# Patient Record
Sex: Male | Born: 1935 | Race: White | Hispanic: No | Smoking: Never smoker
Health system: Southern US, Community
[De-identification: ages and names within clinical notes are randomized; demographics above are authoritative.]

## PROBLEM LIST (undated history)

## (undated) DIAGNOSIS — I10 Essential (primary) hypertension: Secondary | ICD-10-CM

## (undated) DIAGNOSIS — N289 Disorder of kidney and ureter, unspecified: Secondary | ICD-10-CM

## (undated) DIAGNOSIS — C801 Malignant (primary) neoplasm, unspecified: Secondary | ICD-10-CM

## (undated) DIAGNOSIS — F0280 Dementia in other diseases classified elsewhere without behavioral disturbance: Secondary | ICD-10-CM

## (undated) DIAGNOSIS — G309 Alzheimer's disease, unspecified: Secondary | ICD-10-CM

## (undated) HISTORY — PX: CHOLECYSTECTOMY: SHX55

---

## 1997-05-04 ENCOUNTER — Other Ambulatory Visit: Admission: RE | Admit: 1997-05-04 | Discharge: 1997-05-04 | Payer: Self-pay | Admitting: Urology

## 1997-05-13 ENCOUNTER — Encounter: Admission: RE | Admit: 1997-05-13 | Discharge: 1997-08-11 | Payer: Self-pay | Admitting: Radiation Oncology

## 1997-07-30 ENCOUNTER — Ambulatory Visit (HOSPITAL_COMMUNITY): Admission: RE | Admit: 1997-07-30 | Discharge: 1997-07-31 | Payer: Self-pay | Admitting: Urology

## 1997-08-29 ENCOUNTER — Emergency Department (HOSPITAL_COMMUNITY): Admission: EM | Admit: 1997-08-29 | Discharge: 1997-08-29 | Payer: Self-pay | Admitting: Emergency Medicine

## 1997-08-30 ENCOUNTER — Encounter: Admission: RE | Admit: 1997-08-30 | Discharge: 1997-11-28 | Payer: Self-pay | Admitting: Radiation Oncology

## 1997-09-02 ENCOUNTER — Ambulatory Visit (HOSPITAL_COMMUNITY): Admission: RE | Admit: 1997-09-02 | Discharge: 1997-09-02 | Payer: Self-pay | Admitting: Urology

## 2000-12-26 ENCOUNTER — Inpatient Hospital Stay (HOSPITAL_COMMUNITY): Admission: EM | Admit: 2000-12-26 | Discharge: 2000-12-27 | Payer: Self-pay | Admitting: Emergency Medicine

## 2000-12-27 ENCOUNTER — Encounter: Payer: Self-pay | Admitting: Interventional Cardiology

## 2002-04-02 ENCOUNTER — Ambulatory Visit (HOSPITAL_COMMUNITY): Admission: RE | Admit: 2002-04-02 | Discharge: 2002-04-02 | Payer: Self-pay | Admitting: Internal Medicine

## 2005-06-07 ENCOUNTER — Ambulatory Visit: Payer: Self-pay | Admitting: Internal Medicine

## 2005-06-07 ENCOUNTER — Ambulatory Visit (HOSPITAL_COMMUNITY): Admission: RE | Admit: 2005-06-07 | Discharge: 2005-06-07 | Payer: Self-pay | Admitting: Internal Medicine

## 2005-06-07 ENCOUNTER — Encounter (INDEPENDENT_AMBULATORY_CARE_PROVIDER_SITE_OTHER): Payer: Self-pay | Admitting: Specialist

## 2008-12-15 ENCOUNTER — Emergency Department (HOSPITAL_COMMUNITY): Admission: EM | Admit: 2008-12-15 | Discharge: 2008-12-15 | Payer: Self-pay | Admitting: Emergency Medicine

## 2008-12-18 ENCOUNTER — Inpatient Hospital Stay (HOSPITAL_COMMUNITY): Admission: EM | Admit: 2008-12-18 | Discharge: 2008-12-28 | Payer: Self-pay | Admitting: Emergency Medicine

## 2008-12-18 ENCOUNTER — Ambulatory Visit: Payer: Self-pay | Admitting: Cardiology

## 2008-12-21 ENCOUNTER — Encounter (INDEPENDENT_AMBULATORY_CARE_PROVIDER_SITE_OTHER): Payer: Self-pay | Admitting: Family Medicine

## 2009-01-04 ENCOUNTER — Ambulatory Visit (HOSPITAL_COMMUNITY): Admission: RE | Admit: 2009-01-04 | Discharge: 2009-01-04 | Payer: Self-pay | Admitting: Family Medicine

## 2010-05-03 LAB — RAPID URINE DRUG SCREEN, HOSP PERFORMED
Amphetamines: NOT DETECTED
Benzodiazepines: NOT DETECTED
Cocaine: NOT DETECTED

## 2010-05-03 LAB — DIFFERENTIAL
Basophils Absolute: 0 10*3/uL (ref 0.0–0.1)
Basophils Absolute: 0 10*3/uL (ref 0.0–0.1)
Basophils Relative: 0 % (ref 0–1)
Basophils Relative: 0 % (ref 0–1)
Basophils Relative: 0 % (ref 0–1)
Basophils Relative: 1 % (ref 0–1)
Eosinophils Absolute: 0 10*3/uL (ref 0.0–0.7)
Eosinophils Relative: 0 % (ref 0–5)
Lymphocytes Relative: 13 % (ref 12–46)
Lymphocytes Relative: 9 % — ABNORMAL LOW (ref 12–46)
Lymphs Abs: 0.4 10*3/uL — ABNORMAL LOW (ref 0.7–4.0)
Lymphs Abs: 0.5 10*3/uL — ABNORMAL LOW (ref 0.7–4.0)
Lymphs Abs: 1.1 10*3/uL (ref 0.7–4.0)
Monocytes Absolute: 0.4 10*3/uL (ref 0.1–1.0)
Monocytes Absolute: 0.4 10*3/uL (ref 0.1–1.0)
Monocytes Absolute: 1 10*3/uL (ref 0.1–1.0)
Monocytes Relative: 3 % (ref 3–12)
Monocytes Relative: 5 % (ref 3–12)
Neutro Abs: 3.7 10*3/uL (ref 1.7–7.7)
Neutrophils Relative %: 76 % (ref 43–77)
Neutrophils Relative %: 89 % — ABNORMAL HIGH (ref 43–77)
Neutrophils Relative %: 93 % — ABNORMAL HIGH (ref 43–77)

## 2010-05-03 LAB — CBC
HCT: 32.6 % — ABNORMAL LOW (ref 39.0–52.0)
Hemoglobin: 11.1 g/dL — ABNORMAL LOW (ref 13.0–17.0)
MCHC: 33.8 g/dL (ref 30.0–36.0)
MCHC: 34 g/dL (ref 30.0–36.0)
MCHC: 34.3 g/dL (ref 30.0–36.0)
MCHC: 34.4 g/dL (ref 30.0–36.0)
MCV: 90.8 fL (ref 78.0–100.0)
MCV: 91.1 fL (ref 78.0–100.0)
MCV: 91.4 fL (ref 78.0–100.0)
Platelets: 133 10*3/uL — ABNORMAL LOW (ref 150–400)
Platelets: 181 10*3/uL (ref 150–400)
RBC: 3.56 MIL/uL — ABNORMAL LOW (ref 4.22–5.81)
RBC: 4 MIL/uL — ABNORMAL LOW (ref 4.22–5.81)
RDW: 14 % (ref 11.5–15.5)
WBC: 11.8 10*3/uL — ABNORMAL HIGH (ref 4.0–10.5)
WBC: 8.2 10*3/uL (ref 4.0–10.5)
WBC: 8.6 10*3/uL (ref 4.0–10.5)

## 2010-05-03 LAB — COMPREHENSIVE METABOLIC PANEL
BUN: 46 mg/dL — ABNORMAL HIGH (ref 6–23)
Calcium: 8.9 mg/dL (ref 8.4–10.5)
Chloride: 104 mEq/L (ref 96–112)
Chloride: 106 mEq/L (ref 96–112)
Creatinine, Ser: 1.48 mg/dL (ref 0.4–1.5)
Creatinine, Ser: 3.5 mg/dL — ABNORMAL HIGH (ref 0.4–1.5)
GFR calc Af Amer: 20 mL/min — ABNORMAL LOW (ref >60)
GFR calc Af Amer: 55 mL/min — ABNORMAL LOW (ref >60)
GFR calc non Af Amer: 17 mL/min — ABNORMAL LOW (ref >60)
Glucose, Bld: 109 mg/dL — ABNORMAL HIGH (ref 70–99)
Glucose, Bld: 165 mg/dL — ABNORMAL HIGH (ref 70–99)
Potassium: 4.6 mEq/L (ref 3.5–5.1)
Sodium: 137 mEq/L (ref 135–145)
Total Bilirubin: 1 mg/dL (ref 0.3–1.2)
Total Protein: 6.2 g/dL (ref 6.0–8.3)

## 2010-05-03 LAB — BLOOD GAS, ARTERIAL
Acid-base deficit: 3.5 mmol/L — ABNORMAL HIGH (ref 0.0–2.0)
Bicarbonate: 21.1 mEq/L (ref 20.0–24.0)
O2 Content: 2 L/min
O2 Saturation: 94.8 %
pCO2 arterial: 38.5 mmHg (ref 35.0–45.0)
pO2, Arterial: 71.8 mmHg — ABNORMAL LOW (ref 80.0–100.0)

## 2010-05-03 LAB — POCT CARDIAC MARKERS
CKMB, poc: <1 ng/mL — ABNORMAL LOW (ref 1.0–8.0)
CKMB, poc: <1 ng/mL — ABNORMAL LOW (ref 1.0–8.0)
Troponin i, poc: <0.05 ng/mL (ref 0.00–0.09)

## 2010-05-03 LAB — BASIC METABOLIC PANEL
BUN: 21 mg/dL (ref 6–23)
CO2: 23 mEq/L (ref 19–32)
CO2: 25 mEq/L (ref 19–32)
Calcium: 8.4 mg/dL (ref 8.4–10.5)
Chloride: 109 mEq/L (ref 96–112)
Chloride: 111 mEq/L (ref 96–112)
Creatinine, Ser: 1.31 mg/dL (ref 0.4–1.5)
GFR calc Af Amer: 36 mL/min — ABNORMAL LOW (ref >60)
GFR calc Af Amer: 59 mL/min — ABNORMAL LOW (ref >60)
GFR calc non Af Amer: 49 mL/min — ABNORMAL LOW (ref >60)
GFR calc non Af Amer: 52 mL/min — ABNORMAL LOW (ref >60)
Glucose, Bld: 100 mg/dL — ABNORMAL HIGH (ref 70–99)
Potassium: 3.9 mEq/L (ref 3.5–5.1)
Potassium: 3.9 mEq/L (ref 3.5–5.1)
Sodium: 139 mEq/L (ref 135–145)
Sodium: 142 mEq/L (ref 135–145)

## 2010-05-03 LAB — GLUCOSE, CAPILLARY: Glucose-Capillary: 88 mg/dL (ref 70–99)

## 2010-05-03 LAB — CULTURE, BLOOD (ROUTINE X 2)
Culture: NO GROWTH
Report Status: 11252010
Report Status: 11252010

## 2010-05-03 LAB — URINALYSIS, ROUTINE W REFLEX MICROSCOPIC
Glucose, UA: NEGATIVE mg/dL
Nitrite: NEGATIVE
Urobilinogen, UA: 0.2 mg/dL (ref 0.0–1.0)

## 2010-05-03 LAB — URINE MICROSCOPIC-ADD ON

## 2010-05-03 LAB — URINE CULTURE

## 2010-05-18 ENCOUNTER — Encounter: Payer: Self-pay | Admitting: Internal Medicine

## 2010-05-25 ENCOUNTER — Ambulatory Visit: Payer: Self-pay | Admitting: Urgent Care

## 2010-06-16 NOTE — H&P (Signed)
[place]. [facility]  Patient:    Kevin Nunez, Kevin Nunez Visit Number: [id number] MRN: [id number]          Service Type: MED Location: 2000 2006 01 Attending Physician:  Lyn Records. Iii Dictated by:   Darci Needle, M.D. Admit Date:  12/26/2000   CC:         Butch Penny, M.D., [place], [place]  Maretta Bees. Vonita Moss, M.D.   History and Physical  CHIEF COMPLAINT: Chest pain and shortness of breath.  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  The first episode of discomfort occurred shortly after awakening this morning.  It was in the left precordial region, associated with shortness of breath and characterized as a sharp severe discomfort.  He never had anything like this before.  It resolved after approximately one hour.  He felt relatively well for the rest of the day until after he ate Thanksgiving dinner at his daughters house.  He tried to walk back home, which is not very far, and during this walk the same discomfort recurred.  There was also shortness of breath.  No sweating or nausea.  AFter getting home and resting it resolved in about 15-30 minutes.  He called his son, who came over and states that Kevin Nunez looked okay but he brought him to the emergency room to be checked out anyway to be on the safe side.  He is currently pain-free.  He denies orthopnea, PND, palpitations, lower extremity swelling, and syncope.  ALLERGIES: None.  MEDICATIONS: Xalatan eyedrops for glaucoma.  PAST MEDICAL HISTORY:  1. Glaucoma.  2. Prostate cancer, status post radium seed implants.  3. Kidney stones.  FAMILY HISTORY: Noncontributory.  SOCIAL HISTORY: No tobacco or ethanol use.  Widower.  Has two children, a son and a daughter.  He still farms.  REVIEW OF SYSTEMS: Negative for claudication, shortness of breath,  wheezing, COPD, stroke, diabetes, hypertension, cancer, abdominal discomfort, blood in his stool, neurologic symptoms.  PHYSICAL EXAMINATION:  VITAL SIGNS: Blood pressure 160/80, heart rate 80, respiratory rate 16.  HEENT: Unremarkable.  No xanthelasma.  EOMI.  SKIN: Clear to visual inspection.  NECK: No JVD, bruits, or thyromegaly.  CHEST: Clear to auscultation and percussion.  CARDIAC: Positive S4 gallop.  ABDOMEN: Obese, soft, no liver or spleen enlargement noted.  Bowel sounds normal.  EXTREMITIES: No edema.  Pulses 2+.  NEUROLOGIC: The patient is alert and oriented x 3.  LABORATORY DATA: EKG within normal limits, with the exception of PACs.  Chest x-ray, normal size heart, question of COPD.  CK-MB 85/2.5.  Troponin I 0.01.  ASSESSMENT:  1. New onset left chest pain and dyspnea.  Rule out unstable angina, rule out     myocardial infarction, rule out other.  2. Prostate cancer.  3. History of kidney stones.  4. Glaucoma.  PLAN:  1. Admit to rule out MI protocol.  Serial enzymes and EKGs will be obtained.  2. Nitroglycerin IV if recurrent pain.  3. Lovenox subcu.  4. Probable stress Cardiolite if rules out. Dictated by:   Darci Needle, M.D. Attending Physician:  Lyn Records. Iii DD:  12/26/00 TD:  12/27/00 Job: [id number]

## 2010-06-16 NOTE — Op Note (Signed)
NAMETAKASHI, Kevin Nunez             ACCOUNT NO.:  [id number]   MEDICAL RECORD NO.:  [id number]          PATIENT TYPE:  AMB   LOCATION:  DAY                           FACILITY:  [facility]   PHYSICIAN:  R. Roetta Sessions, M.D. DATE OF BIRTH:  Sep 06, 1921   DATE OF PROCEDURE:  06/07/2005  DATE OF DISCHARGE:                                 OPERATIVE REPORT   PROCEDURE:  Colonoscopy with cold biopsy.   INDICATIONS FOR PROCEDURE:  The patient is a 77 year old Caucasian male with  a history of colonic adenomatous polyps.  His last colonoscopy was in 2004.  He is here for surveillance.  He does not have any symptoms currently.  This  approach has been discussed with the patient at length.  The potential  risks, benefits, and alternatives have been reviewed and questions answered.  He is agreeable.  Please see the documentation in the medical record.   PROCEDURE:  O2 saturation, blood pressure, pulses, and respirations were  monitored throughout the entirety of the procedure.  Conscious sedation was  with IV Versed and Demerol in incremental doses.  The instrument used was  the Olympus Pentax system.   FINDINGS:  Digital rectal examination revealed no abnormalities.   ENDOSCOPIC FINDINGS:  The prep was adequate.   Rectum:  Examination of the rectal mucosa including retroflex view of the  anal verge revealed no abnormalities.   Colon:  The colonic mucosa was surveyed from the rectosigmoid junction  through the left, transverse, right colon to the area of the appendiceal  orifice, ileocecal valve, and cecum.  These structures were well-seen and  photographed for the record.  From this level, the scope was slowly  withdrawn.  All previously mentioned mucosal surfaces were again seen.  The  patient was noted to have sigmoid diverticula and a diminutive 4-mm polyp at  25 cm which was cold biopsied/removed.  It was being recovered at the time  of this dictation; otherwise, the colonic mucosa appeared  normal.  The  patient tolerated the procedure well and was reactive in endoscopy.   IMPRESSION:  1.  Normal rectum.  2.  Sigmoid diverticula.  3.  Diminutive polyp at 25 cm, cold biopsied/removed.  The remainder of the      colonic mucosa appeared normal.   RECOMMENDATIONS:  1.  Follow up on pathology.  2.  Further recommendations to follow.      Jonathon Bellows, M.D.  Electronically Signed     RMR/MEDQ  D:  06/07/2005  T:  06/08/2005  Job:  [id number]   cc:   Angus G. Renard Matter, MD  Fax: [phone]

## 2010-08-25 ENCOUNTER — Emergency Department (HOSPITAL_COMMUNITY): Payer: Medicare Other

## 2010-08-25 ENCOUNTER — Emergency Department (HOSPITAL_COMMUNITY)
Admission: EM | Admit: 2010-08-25 | Discharge: 2010-08-25 | Disposition: A | Discharge status: Home / Self Care | Payer: Medicare Other | Attending: Emergency Medicine | Admitting: Emergency Medicine

## 2010-08-25 DIAGNOSIS — N201 Calculus of ureter: Secondary | ICD-10-CM | POA: Insufficient documentation

## 2010-08-25 DIAGNOSIS — Z8546 Personal history of malignant neoplasm of prostate: Secondary | ICD-10-CM | POA: Insufficient documentation

## 2010-08-25 DIAGNOSIS — H409 Unspecified glaucoma: Secondary | ICD-10-CM | POA: Insufficient documentation

## 2010-08-25 DIAGNOSIS — R109 Unspecified abdominal pain: Secondary | ICD-10-CM | POA: Insufficient documentation

## 2010-08-25 DIAGNOSIS — I10 Essential (primary) hypertension: Secondary | ICD-10-CM | POA: Insufficient documentation

## 2010-08-25 DIAGNOSIS — Z79899 Other long term (current) drug therapy: Secondary | ICD-10-CM | POA: Insufficient documentation

## 2010-08-25 DIAGNOSIS — N1330 Unspecified hydronephrosis: Secondary | ICD-10-CM | POA: Insufficient documentation

## 2010-08-25 DIAGNOSIS — N289 Disorder of kidney and ureter, unspecified: Secondary | ICD-10-CM | POA: Insufficient documentation

## 2010-08-25 LAB — URINALYSIS, ROUTINE W REFLEX MICROSCOPIC
Glucose, UA: NEGATIVE mg/dL
Leukocytes, UA: NEGATIVE
Protein, ur: 30 mg/dL — AB
Specific Gravity, Urine: 1.024 (ref 1.005–1.030)

## 2010-08-25 LAB — URINE MICROSCOPIC-ADD ON

## 2010-08-25 LAB — POCT I-STAT, CHEM 8
BUN: 23 mg/dL (ref 6–23)
Chloride: 111 mEq/L (ref 96–112)
Potassium: 4.5 mEq/L (ref 3.5–5.1)
Sodium: 142 mEq/L (ref 135–145)
TCO2: 22 mmol/L (ref 0–100)

## 2013-12-23 ENCOUNTER — Emergency Department (HOSPITAL_COMMUNITY)
Admission: EM | Admit: 2013-12-23 | Discharge: 2013-12-23 | Disposition: A | Discharge status: Home / Self Care | Payer: Medicare Other | Attending: Emergency Medicine | Admitting: Emergency Medicine

## 2013-12-23 ENCOUNTER — Encounter (HOSPITAL_COMMUNITY): Payer: Self-pay | Admitting: Emergency Medicine

## 2013-12-23 DIAGNOSIS — K4090 Unilateral inguinal hernia, without obstruction or gangrene, not specified as recurrent: Secondary | ICD-10-CM | POA: Diagnosis present

## 2013-12-23 DIAGNOSIS — K4091 Unilateral inguinal hernia, without obstruction or gangrene, recurrent: Secondary | ICD-10-CM | POA: Diagnosis not present

## 2013-12-23 NOTE — Discharge Instructions (Signed)
-Your hip pain is due to an inguinal hernia. -You hernia is easily reducible, so you do not require urgent surgery. -However, you should go to your follow up appointment with [facility] to discuss getting the hernia repaired to help with your pain. -In the meantime, get your Norco prescription from urgent care filled and try taking 1/2 of a pill to see if that helps with pain.   Inguinal Hernia, Adult Muscles help keep everything in the body in its proper place. But if a weak spot in the muscles develops, something can poke through. That is called a hernia. When this happens in the lower part of the belly (abdomen), it is called an inguinal hernia. (It takes its name from a part of the body in this region called the inguinal canal.) A weak spot in the wall of muscles lets some fat or part of the small intestine bulge through. An inguinal hernia can develop at any age. Men get them more often than women. CAUSES  In adults, an inguinal hernia develops over time.  It can be triggered by:  Suddenly straining the muscles of the lower abdomen.  Lifting heavy objects.  Straining to have a bowel movement. Difficult bowel movements (constipation) can lead to this.  Constant coughing. This may be caused by smoking or lung disease.  Being overweight.  Being pregnant.  Working at a job that requires long periods of standing or heavy lifting.  Having had an inguinal hernia before. One type can be an emergency situation. It is called a strangulated inguinal hernia. It develops if part of the small intestine slips through the weak spot and cannot get back into the abdomen. The blood supply can be cut off. If that happens, part of the intestine may die. This situation requires emergency surgery. SYMPTOMS  Often, a small inguinal hernia has no symptoms. It is found when a healthcare provider does a physical exam. Larger hernias usually have symptoms.   In adults, symptoms may  include:  A lump in the groin. This is easier to see when the person is standing. It might disappear when lying down.  In men, a lump in the scrotum.  Pain or burning in the groin. This occurs especially when lifting, straining or coughing.  A dull ache or feeling of pressure in the groin.  Signs of a strangulated hernia can include:  A bulge in the groin that becomes very painful and tender to the touch.  A bulge that turns red or purple.  Fever, nausea and vomiting.  Inability to have a bowel movement or to pass gas. DIAGNOSIS  To decide if you have an inguinal hernia, a healthcare provider will probably do a physical examination.  This will include asking questions about any symptoms you have noticed.  The healthcare provider might feel the groin area and ask you to cough. If an inguinal hernia is felt, the healthcare provider may try to slide it back into the abdomen.  Usually no other tests are needed. TREATMENT  Treatments can vary. The size of the hernia makes a difference. Options include:  Watchful waiting. This is often suggested if the hernia is small and you have had no symptoms.  No medical procedure will be done unless symptoms develop.  You will need to watch closely for symptoms. If any occur, contact your healthcare provider right away.  Surgery. This is used if the hernia is larger or you have symptoms.  Open surgery. This is usually an outpatient  procedure (you will not stay overnight in a hospital). An cut (incision) is made through the skin in the groin. The hernia is put back inside the abdomen. The weak area in the muscles is then repaired by herniorrhaphy or hernioplasty. Herniorrhaphy: in this type of surgery, the weak muscles are sewn back together. Hernioplasty: a patch or mesh is used to close the weak area in the abdominal wall.  Laparoscopy. In this procedure, a surgeon makes small incisions. A thin tube with a tiny video camera (called a  laparoscope) is put into the abdomen. The surgeon repairs the hernia with mesh by looking with the video camera and using two long instruments. HOME CARE INSTRUCTIONS   After surgery to repair an inguinal hernia:  You will need to take pain medicine prescribed by your healthcare provider. Follow all directions carefully.  You will need to take care of the wound from the incision.  Your activity will be restricted for awhile. This will probably include no heavy lifting for several weeks. You also should not do anything too active for a few weeks. When you can return to work will depend on the type of job that you have.  During "watchful waiting" periods, you should:  Maintain a healthy weight.  Eat a diet high in fiber (fruits, vegetables and whole grains).  Drink plenty of fluids to avoid constipation. This means drinking enough water and other liquids to keep your urine clear or pale yellow.  Do not lift heavy objects.  Do not stand for long periods of time.  Quit smoking. This should keep you from developing a frequent cough. SEEK MEDICAL CARE IF:   A bulge develops in your groin area.  You feel pain, a burning sensation or pressure in the groin. This might be worse if you are lifting or straining.  You develop a fever of more than 100.5 F (38.1 C). SEEK IMMEDIATE MEDICAL CARE IF:   Pain in the groin increases suddenly.  A bulge in the groin gets bigger suddenly and does not go down.  For men, there is sudden pain in the scrotum. Or, the size of the scrotum increases.  A bulge in the groin area becomes red or purple and is painful to touch.  You have nausea or vomiting that does not go away.  You feel your heart beating much faster than normal.  You cannot have a bowel movement or pass gas.  You develop a fever of more than 102.0 F (38.9 C). Document Released: 06/03/2008 Document Revised: 04/09/2011 Document Reviewed: 06/03/2008 [facility] Patient Information  2015 [place], [place]. This information is not intended to replace advice given to you by your health care provider. Make sure you discuss any questions you have with your health care provider.

## 2013-12-23 NOTE — ED Notes (Signed)
Left groin tender, no redness noted, no significant swelling noted.

## 2013-12-23 NOTE — ED Provider Notes (Signed)
I saw and evaluated the patient, reviewed the resident's note and I agree with the findings and plan.  Several days intermittent left inguinal pain lasts a few minutes at a time without radiation without weakness without numbness without abdominal pain; appears to have intermittent easily reducible mildly tender left inguinal hernia with testicles nontender doubt incarcerated hernia doubt strangulated hernia with abdomen soft and nontender with repeat examination of left inguinal region nontender after hernia reduction; patient already has surgery appointment scheduled.   Babette Relic, MD 12/24/13 [phone]

## 2013-12-23 NOTE — ED Provider Notes (Signed)
CSN: [id number]     Arrival date & time 12/23/13  1307 History   First MD Initiated Contact with Patient 12/23/13 1315     Chief Complaint  Patient presents with  . Inguinal Hernia   HPI Kevin Nunez is a 80 year old man presenting with pain in his left inguinal crease.  He reports intermittent sharp pain with movement of his left leg and standing for several months that has worsened over the last few days.  He has noted a bump in his left inguinal region that intermittently comes and goes.  He denies any falls or injury to his hip, and he is generally able to walk around at home.  He lives with his children who help care for him.  He was seen at urgent care yesterday and left hip x-rays were performed, which were reportedly normal.  He was diagnosed with an inguinal hernia and follow up was arranged at [facility] to discuss having the hernia repaired.  He was given a prescription for Norco 5-325 mg, which he has been trying with mild relief of his pain.  He denies fevers, chills, abdominal pain, nausea, vomiting, diarrhea, constipation, or blood in his stool.  History reviewed. No pertinent past medical history. Past Surgical History  Procedure Laterality Date  . Cholecystectomy     History reviewed. No pertinent family history. History  Substance Use Topics  . Smoking status: Never Smoker   . Smokeless tobacco: Not on file  . Alcohol Use: No    Review of Systems  Constitutional: Negative for fever, chills and fatigue.  HENT: Negative for congestion and rhinorrhea.   Respiratory: Negative for cough, chest tightness and shortness of breath.   Cardiovascular: Negative for chest pain and palpitations.  Gastrointestinal: Negative for nausea, vomiting, abdominal pain, diarrhea, constipation, blood in stool and abdominal distention.  Genitourinary: Negative for dysuria, hematuria and difficulty urinating.  Musculoskeletal: Positive for myalgias and arthralgias.  Skin:  Negative for rash.  Neurological: Negative for dizziness, weakness, light-headedness and numbness.      Allergies  Review of patient's allergies indicates no known allergies.  Home Medications   Prior to Admission medications   Not on File   BP 140/74 mmHg  Pulse 79  Temp(Src) 97.7 F (36.5 C) (Oral)  Resp 20  Ht 5\' 8"  (1.727 m)  Wt 166 lb (75.297 kg)  BMI 25.25 kg/m2  SpO2 100% Physical Exam  Constitutional: He is oriented to person, place, and time. He appears well-developed and well-nourished. No distress.  HENT:  Head: Normocephalic and atraumatic.  Mouth/Throat: No oropharyngeal exudate.  Eyes: Conjunctivae and EOM are normal. Pupils are equal, round, and reactive to light. No scleral icterus.  Neck: Normal range of motion. Neck supple.  Cardiovascular: Normal rate, regular rhythm and normal heart sounds.   Pulmonary/Chest: Effort normal and breath sounds normal. No respiratory distress.  Abdominal: Soft. Bowel sounds are normal. He exhibits no distension. There is no tenderness.  Musculoskeletal: Normal range of motion. He exhibits tenderness (With movement of left hip in inguinal fold.). He exhibits no edema.  1-2 cm bulge in left inguinal region, intermittently palpable with hip flexion.  No pain with passive motion and rotation of hip.  Neurological: He is alert and oriented to person, place, and time. No cranial nerve deficit. He exhibits normal muscle tone.  Skin: Skin is warm and dry. No rash noted. No erythema.    ED Course  Procedures (including critical care time) Labs Review Labs Reviewed -  No data to display  Imaging Review No results found.   EKG Interpretation None      MDM   Final diagnoses:  Unilateral recurrent inguinal hernia without obstruction or gangrene   Agree with diagnosis of inguinal hernia made at urgent care.  Low concern for hip injury with normal x-ray, description of symptoms, and physical exam.  Discussed with family the  need for rest until follow up with surgery and return precautions.  I am reluctant to prescribe stronger pain medications due to his advanced age and lack of opioid exposure.  Discussed icing the area and alternating between ibuprofen and Vicodin to make it until surgery follow up.  Arman Filter, MD 12/23/13 [phone]

## 2013-12-23 NOTE — ED Notes (Signed)
Pt diagnosed with inguinal hernia yesterday at urgent care. Pt was given 5 mg norco prescription. Pt taking medication, however is having increasing pain. He is able to walk, but painful. Pain 6/10.

## 2014-01-18 ENCOUNTER — Ambulatory Visit (HOSPITAL_COMMUNITY)
Admission: RE | Admit: 2014-01-18 | Discharge: 2014-01-18 | Disposition: A | Discharge status: Home / Self Care | Payer: Medicare Other | Source: Ambulatory Visit | Attending: Orthopedic Surgery | Admitting: Orthopedic Surgery

## 2014-01-18 ENCOUNTER — Other Ambulatory Visit (HOSPITAL_COMMUNITY): Payer: Self-pay | Admitting: Orthopedic Surgery

## 2014-01-18 DIAGNOSIS — M25852 Other specified joint disorders, left hip: Secondary | ICD-10-CM | POA: Insufficient documentation

## 2014-01-18 DIAGNOSIS — M25552 Pain in left hip: Secondary | ICD-10-CM | POA: Diagnosis not present

## 2014-01-18 DIAGNOSIS — R1030 Lower abdominal pain, unspecified: Secondary | ICD-10-CM | POA: Diagnosis present

## 2014-01-18 DIAGNOSIS — T148XXA Other injury of unspecified body region, initial encounter: Secondary | ICD-10-CM

## 2014-02-23 ENCOUNTER — Telehealth: Payer: Self-pay | Admitting: Family Medicine

## 2014-02-23 NOTE — Telephone Encounter (Addendum)
Patient currently has [place] and he currently is taking benazepril 20mg  appointment scheduled for 2/25 with [facility].

## 2014-03-25 ENCOUNTER — Encounter: Payer: Self-pay | Admitting: Family Medicine

## 2014-03-25 ENCOUNTER — Ambulatory Visit (INDEPENDENT_AMBULATORY_CARE_PROVIDER_SITE_OTHER): Payer: Medicare Other | Admitting: Family Medicine

## 2014-03-25 ENCOUNTER — Encounter (INDEPENDENT_AMBULATORY_CARE_PROVIDER_SITE_OTHER): Payer: Self-pay

## 2014-03-25 VITALS — BP 105/69 | HR 95 | Temp 97.0°F | Ht 65.0 in | Wt 170.6 lb

## 2014-03-25 DIAGNOSIS — G309 Alzheimer's disease, unspecified: Secondary | ICD-10-CM | POA: Diagnosis not present

## 2014-03-25 DIAGNOSIS — I10 Essential (primary) hypertension: Secondary | ICD-10-CM | POA: Diagnosis not present

## 2014-03-25 DIAGNOSIS — F0280 Dementia in other diseases classified elsewhere without behavioral disturbance: Secondary | ICD-10-CM | POA: Insufficient documentation

## 2014-03-25 LAB — POCT URINALYSIS DIPSTICK
BILIRUBIN UA: NEGATIVE
Blood, UA: NEGATIVE
GLUCOSE UA: NEGATIVE
Ketones, UA: NEGATIVE
NITRITE UA: NEGATIVE
Protein, UA: NEGATIVE
Spec Grav, UA: 1.02
Urobilinogen, UA: NEGATIVE
pH, UA: 6

## 2014-03-25 LAB — POCT CBC
Granulocyte percent: 73 %G (ref 37–80)
HEMATOCRIT: 46.4 % (ref 43.5–53.7)
Hemoglobin: 14.7 g/dL (ref 14.1–18.1)
Lymph, poc: 1.5 (ref 0.6–3.4)
MCH, POC: 28.4 pg (ref 27–31.2)
MCHC: 31.6 g/dL — AB (ref 31.8–35.4)
MCV: 89.9 fL (ref 80–97)
MPV: 7.2 fL (ref 0–99.8)
POC GRANULOCYTE: 5.4 (ref 2–6.9)
POC LYMPH %: 19.6 % (ref 10–50)
Platelet Count, POC: 204 10*3/uL (ref 142–424)
RBC: 5.16 M/uL (ref 4.69–6.13)
RDW, POC: 13.2 %
WBC: 7.4 10*3/uL (ref 4.6–10.2)

## 2014-03-25 MED ORDER — DONEPEZIL HCL 5 MG PO TABS: 5.0000 mg | ORAL_TABLET | Freq: Every morning | ORAL | Status: DC

## 2014-03-25 NOTE — Progress Notes (Signed)
Subjective:  Patient ID: Kevin Nunez, male    DOB: 1921-12-04  Age: 81 y.o. MRN: [id number]  CC: Establish Care   HPI Kevin Nunez presents for new patient evaluation today. This includes treatment for his ongoing diagnosis of hypertension.Patient in for follow-up of hypertension. Patient has no history of headache chest pain or shortness of breath or recent cough. Patient also denies symptoms of TIA such as numbness weakness lateralizing. Patient checks  blood pressure at home and has not had any elevated readings recently. Patient denies side effects from his medication. States taking it regularly. Additionally, the family is interested in discussing treatment for dementia. He seems to be forgetting multiple recent events. He is less able to converse. He has been repeating himself. He is unable to perform basic mathematics skills.  History Kevin Nunez has no past medical history on file.   He has past surgical history that includes Cholecystectomy.   His family history is not on file.He reports that he has never smoked. He does not have any smokeless tobacco history on file. He reports that he does not drink alcohol or use illicit drugs.  No current outpatient prescriptions on file prior to visit.   No current facility-administered medications on file prior to visit.    ROS Review of Systems  Constitutional: Positive for activity change (becoming less active around the house. Sitting watching TV more instead of his usual activities.) and appetite change (decreased somewhat). Negative for fever, chills, diaphoresis, fatigue and unexpected weight change.  HENT: Negative for congestion, ear pain, hearing loss, postnasal drip, rhinorrhea, sore throat, tinnitus and trouble swallowing.   Eyes: Negative for photophobia, pain, discharge and redness.  Respiratory: Negative for apnea, cough, choking, chest tightness, shortness of breath, wheezing and stridor.   Cardiovascular: Negative for  chest pain, palpitations and leg swelling.  Gastrointestinal: Negative for nausea, vomiting, abdominal pain, diarrhea, constipation, blood in stool and abdominal distention.  Endocrine: Negative for cold intolerance, heat intolerance, polydipsia, polyphagia and polyuria.  Genitourinary: Negative for dysuria, urgency, frequency, hematuria, flank pain, enuresis, difficulty urinating and genital sores.  Musculoskeletal: Negative for joint swelling and arthralgias.  Skin: Negative for color change, rash and wound.  Allergic/Immunologic: Negative for immunocompromised state.  Neurological: Positive for weakness. Negative for dizziness, tremors, seizures, syncope, facial asymmetry, speech difficulty, light-headedness, numbness and headaches.  Hematological: Does not bruise/bleed easily.  Psychiatric/Behavioral: Positive for confusion and decreased concentration. Negative for suicidal ideas, hallucinations, behavioral problems, sleep disturbance, dysphoric mood and agitation. The patient is not nervous/anxious and is not hyperactive.     Objective:  BP 105/69 mmHg  Pulse 95  Temp(Src) 97 F (36.1 C) (Oral)  Ht _0  (1.651 m)  Wt 170 lb 9.6 oz (77.384 kg)  BMI 28.39 kg/m2  BP Readings from Last 3 Encounters:  03/25/14 105/69  12/23/13 140/74    Wt Readings from Last 3 Encounters:  03/25/14 170 lb 9.6 oz (77.384 kg)  01/18/14 168 lb (76.204 kg)  12/23/13 166 lb (75.297 kg)     Physical Exam  Constitutional: He is oriented to person, place, and time. He appears well-developed and well-nourished. No distress.  HENT:  Head: Normocephalic and atraumatic.  Right Ear: External ear normal.  Left Ear: External ear normal.  Nose: Nose normal.  Mouth/Throat: Oropharynx is clear and moist.  Eyes: Conjunctivae and EOM are normal. Pupils are equal, round, and reactive to light.  Neck: Normal range of motion. Neck supple. No thyromegaly present.  Cardiovascular: Normal rate, regular rhythm and  normal heart sounds.   No murmur heard. Pulmonary/Chest: Effort normal and breath sounds normal. No respiratory distress. He has no wheezes. He has no rales.  Abdominal: Soft. Bowel sounds are normal. He exhibits no distension. There is no tenderness.  Lymphadenopathy:    He has no cervical adenopathy.  Neurological: He is alert and oriented to person, place, and time. He has normal reflexes. No cranial nerve deficit. Coordination (Slightly unsteady gait) abnormal.  Skin: Skin is warm and dry.  Psychiatric: Judgment and thought content normal. His mood appears not anxious. His affect is labile. His speech is delayed and slurred. He is slowed. He does not exhibit a depressed mood. He exhibits abnormal recent memory (Diminished).  mini-mental status exam performed. Score is 5/23   No results found for: HGBA1C  Lab Results  Component Value Date   WBC 7.4 03/25/2014   HGB 14.7 03/25/2014   HCT 46.4 03/25/2014   PLT 181 12/23/2008   GLUCOSE 112* 03/25/2014   CHOL 209* 03/25/2014   TRIG 183* 03/25/2014   HDL 55 03/25/2014   LDLCALC 117* 03/25/2014   ALT 8 03/25/2014   AST 15 03/25/2014   NA 143 03/25/2014   K 5.5* 03/25/2014   CL 105 03/25/2014   CREATININE 1.92* 03/25/2014   BUN 33 03/25/2014   CO2 25 03/25/2014   TSH 4.580* 03/25/2014   PSA * 12/20/2008    <0.01 (NOTE) Result repeated and verified. Test Methodology: [employer] PSA    Mr Hip Left Wo Contrast  01/18/2014   CLINICAL DATA:  Left hip, groin and thigh pain for approximately 2 weeks. No known injury.  EXAM: MR OF THE LEFT HIP WITHOUT CONTRAST  TECHNIQUE: Multiplanar, multisequence MR imaging was performed. No intravenous contrast was administered.  COMPARISON:  Plain films left hip 12/22/2013.  FINDINGS: Bones: Small subchondral cysts are seen in the acetabular roofs, more prominent on the left.  Articular cartilage and labrum  Articular cartilage:  Mildly degenerated bilaterally.  Labrum:  Mildly degenerated  bilaterally.  Joint or bursal effusion  Joint effusion:  None.  Bursae:  Unremarkable.  Muscles and tendons  Muscles and tendons:  Intact and normal in appearance throughout.  Other findings  Miscellaneous:  None.  IMPRESSION: Negative examination. Very mild degenerative change about the hips is noted.   Electronically Signed   By: Inge Rise M.D.   On: 01/18/2014 12:30    Assessment & Plan:   Shadow was seen today for establish care.  Diagnoses and all orders for this visit:  Essential hypertension Orders: -     POCT CBC -     CMP14+EGFR -     TSH -     POCT urinalysis dipstick -     Lipid panel  Alzheimer's disease Orders: -     donepezil (ARICEPT) 5 MG tablet; Take 1 tablet (5 mg total) by mouth AC breakfast. -     CMP14+EGFR -     TSH   I am having Kevin Nunez start on donepezil.  Meds ordered this encounter  Medications  . donepezil (ARICEPT) 5 MG tablet    Sig: Take 1 tablet (5 mg total) by mouth AC breakfast.    Dispense:  30 tablet    Refill:  2    Follow-up: Return in about 1 month (around 04/23/2014) for dementia.  Claretta Fraise, M.D.

## 2014-03-26 ENCOUNTER — Other Ambulatory Visit: Payer: Self-pay | Admitting: Family Medicine

## 2014-03-26 LAB — CMP14+EGFR
A/G RATIO: 1.6 (ref 1.1–2.5)
ALK PHOS: 68 IU/L (ref 39–117)
ALT: 8 IU/L (ref 0–44)
AST: 15 IU/L (ref 0–40)
Albumin: 4.4 g/dL (ref 3.2–4.6)
BUN/Creatinine Ratio: 17 (ref 10–22)
BUN: 33 mg/dL (ref 10–36)
Bilirubin Total: 0.5 mg/dL (ref 0.0–1.2)
CALCIUM: 9.5 mg/dL (ref 8.6–10.2)
CO2: 25 mmol/L (ref 18–29)
Chloride: 105 mmol/L (ref 97–108)
Creatinine, Ser: 1.92 mg/dL — ABNORMAL HIGH (ref 0.76–1.27)
GFR calc Af Amer: 35 mL/min/{1.73_m2} — ABNORMAL LOW (ref >59)
GFR, EST NON AFRICAN AMERICAN: 30 mL/min/{1.73_m2} — AB (ref >59)
Globulin, Total: 2.7 g/dL (ref 1.5–4.5)
Glucose: 112 mg/dL — ABNORMAL HIGH (ref 65–99)
POTASSIUM: 5.5 mmol/L — AB (ref 3.5–5.2)
SODIUM: 143 mmol/L (ref 134–144)
Total Protein: 7.1 g/dL (ref 6.0–8.5)

## 2014-03-26 LAB — LIPID PANEL
Chol/HDL Ratio: 3.8 ratio units (ref 0.0–5.0)
Cholesterol, Total: 209 mg/dL — ABNORMAL HIGH (ref 100–199)
HDL: 55 mg/dL (ref >39)
LDL Calculated: 117 mg/dL — ABNORMAL HIGH (ref 0–99)
TRIGLYCERIDES: 183 mg/dL — AB (ref 0–149)
VLDL CHOLESTEROL CAL: 37 mg/dL (ref 5–40)

## 2014-03-26 LAB — TSH: TSH: 4.58 u[IU]/mL — ABNORMAL HIGH (ref 0.450–4.500)

## 2014-03-26 MED ORDER — LEVOTHYROXINE SODIUM 50 MCG PO TABS: 50.0000 ug | ORAL_TABLET | Freq: Every day | ORAL | Status: DC

## 2014-04-26 ENCOUNTER — Ambulatory Visit: Payer: Medicare Other | Admitting: Family Medicine

## 2014-06-10 ENCOUNTER — Emergency Department (HOSPITAL_COMMUNITY): Payer: Medicare Other

## 2014-06-10 ENCOUNTER — Emergency Department (HOSPITAL_COMMUNITY)
Admission: EM | Admit: 2014-06-10 | Discharge: 2014-06-10 | Disposition: A | Discharge status: Home / Self Care | Payer: Medicare Other | Attending: Emergency Medicine | Admitting: Emergency Medicine

## 2014-06-10 ENCOUNTER — Encounter (HOSPITAL_COMMUNITY): Payer: Self-pay

## 2014-06-10 DIAGNOSIS — R112 Nausea with vomiting, unspecified: Secondary | ICD-10-CM

## 2014-06-10 DIAGNOSIS — F419 Anxiety disorder, unspecified: Secondary | ICD-10-CM | POA: Insufficient documentation

## 2014-06-10 DIAGNOSIS — R1010 Upper abdominal pain, unspecified: Secondary | ICD-10-CM

## 2014-06-10 DIAGNOSIS — Z79899 Other long term (current) drug therapy: Secondary | ICD-10-CM | POA: Insufficient documentation

## 2014-06-10 DIAGNOSIS — R1013 Epigastric pain: Secondary | ICD-10-CM | POA: Insufficient documentation

## 2014-06-10 DIAGNOSIS — Z9049 Acquired absence of other specified parts of digestive tract: Secondary | ICD-10-CM | POA: Insufficient documentation

## 2014-06-10 LAB — COMPREHENSIVE METABOLIC PANEL
ALT: 16 U/L — AB (ref 17–63)
ANION GAP: 7 (ref 5–15)
AST: 21 U/L (ref 15–41)
Albumin: 4.3 g/dL (ref 3.5–5.0)
Alkaline Phosphatase: 63 U/L (ref 38–126)
BUN: 37 mg/dL — ABNORMAL HIGH (ref 6–20)
CALCIUM: 9.6 mg/dL (ref 8.9–10.3)
CO2: 26 mmol/L (ref 22–32)
CREATININE: 1.75 mg/dL — AB (ref 0.61–1.24)
Chloride: 105 mmol/L (ref 101–111)
GFR calc Af Amer: 38 mL/min — ABNORMAL LOW (ref >60)
GFR, EST NON AFRICAN AMERICAN: 33 mL/min — AB (ref >60)
Glucose, Bld: 93 mg/dL (ref 65–99)
Potassium: 4.9 mmol/L (ref 3.5–5.1)
SODIUM: 138 mmol/L (ref 135–145)
TOTAL PROTEIN: 7.3 g/dL (ref 6.5–8.1)
Total Bilirubin: 1.2 mg/dL (ref 0.3–1.2)

## 2014-06-10 LAB — CBC WITH DIFFERENTIAL/PLATELET
Basophils Absolute: 0 10*3/uL (ref 0.0–0.1)
Basophils Relative: 1 % (ref 0–1)
EOS PCT: 1 % (ref 0–5)
Eosinophils Absolute: 0.1 10*3/uL (ref 0.0–0.7)
HCT: 45.4 % (ref 39.0–52.0)
Hemoglobin: 14.9 g/dL (ref 13.0–17.0)
LYMPHS ABS: 1.4 10*3/uL (ref 0.7–4.0)
LYMPHS PCT: 22 % (ref 12–46)
MCH: 30.3 pg (ref 26.0–34.0)
MCHC: 32.8 g/dL (ref 30.0–36.0)
MCV: 92.3 fL (ref 78.0–100.0)
MONO ABS: 0.6 10*3/uL (ref 0.1–1.0)
Monocytes Relative: 10 % (ref 3–12)
Neutro Abs: 4.3 10*3/uL (ref 1.7–7.7)
Neutrophils Relative %: 66 % (ref 43–77)
Platelets: 144 10*3/uL — ABNORMAL LOW (ref 150–400)
RBC: 4.92 MIL/uL (ref 4.22–5.81)
RDW: 13.1 % (ref 11.5–15.5)
WBC: 6.5 10*3/uL (ref 4.0–10.5)

## 2014-06-10 LAB — URINE MICROSCOPIC-ADD ON

## 2014-06-10 LAB — URINALYSIS, ROUTINE W REFLEX MICROSCOPIC
Bilirubin Urine: NEGATIVE
Glucose, UA: NEGATIVE mg/dL
Ketones, ur: NEGATIVE mg/dL
Nitrite: NEGATIVE
PROTEIN: NEGATIVE mg/dL
SPECIFIC GRAVITY, URINE: 1.02 (ref 1.005–1.030)
UROBILINOGEN UA: 0.2 mg/dL (ref 0.0–1.0)
pH: 6.5 (ref 5.0–8.0)

## 2014-06-10 LAB — LIPASE, BLOOD: Lipase: 32 U/L (ref 22–51)

## 2014-06-10 MED ORDER — ONDANSETRON 4 MG PO TBDP: 4.0000 mg | ORAL_TABLET | Freq: Three times a day (TID) | ORAL | Status: DC | PRN

## 2014-06-10 MED ORDER — OMEPRAZOLE 20 MG PO CPDR: 20.0000 mg | DELAYED_RELEASE_CAPSULE | Freq: Every day | ORAL | Status: DC

## 2014-06-10 MED ORDER — SODIUM CHLORIDE 0.9 % IV SOLN
1000.0000 mL | INTRAVENOUS | Status: DC
  Administered 2014-06-10: 1000 mL via INTRAVENOUS

## 2014-06-10 MED ORDER — ONDANSETRON HCL 4 MG/2ML IJ SOLN
4.0000 mg | Freq: Once | INTRAMUSCULAR | Status: AC
  Administered 2014-06-10: 4 mg via INTRAVENOUS
  Filled 2014-06-10: qty 2

## 2014-06-10 NOTE — ED Notes (Signed)
Pt ambulated to restroom & returned to room w/ no complications. 

## 2014-06-10 NOTE — ED Notes (Signed)
Pt alert & oriented x4, stable gait. Patient given discharge instructions, paperwork & prescription(s). Patient verbalized understanding. Pt left department by wheelchair w/ no further questions. 

## 2014-06-10 NOTE — ED Notes (Signed)
Pt states nausea & pain are better at present. Pt aware of waiting for CT scan to be done around 2000.

## 2014-06-10 NOTE — ED Provider Notes (Signed)
CSN: [id number]     Arrival date & time 06/10/14  1453 History   First MD Initiated Contact with Patient 06/10/14 1715     Chief Complaint  Patient presents with  . Nausea     (Consider location/radiation/quality/duration/timing/severity/associated sxs/prior Treatment) HPI Patient presents with his daughter who assists with the history of present illness. He states that over the past few days the patient has had increasing abdominal pain, nausea, by mouth intolerance. Nausea has been present for longer, but the pain is new. The pain is focally in the epigastrium, sore, nonradiating, severe. There is associated postprandial vomiting. No new fever, chills, syncope, chest pain, dyspnea. Patient is otherwise in his usual state of health.  History reviewed. No pertinent past medical history. Past Surgical History  Procedure Laterality Date  . Cholecystectomy     No family history on file. History  Substance Use Topics  . Smoking status: Never Smoker   . Smokeless tobacco: Not on file  . Alcohol Use: No    Review of Systems  Constitutional:       Per HPI, otherwise negative  HENT:       Per HPI, otherwise negative  Respiratory:       Per HPI, otherwise negative  Cardiovascular:       Per HPI, otherwise negative  Gastrointestinal: Positive for vomiting and abdominal pain.  Endocrine:       Negative aside from HPI  Genitourinary:       Neg aside from HPI   Musculoskeletal:       Per HPI, otherwise negative  Skin: Negative.   Neurological: Negative for syncope.  Psychiatric/Behavioral: The patient is nervous/anxious.       Allergies  Review of patient's allergies indicates no known allergies.  Home Medications   Prior to Admission medications   Medication Sig Start Date End Date Taking? Authorizing Provider  allopurinol (ZYLOPRIM) 100 MG tablet Take 100 mg by mouth daily. 05/13/14  Yes Historical Provider, MD  benazepril (LOTENSIN) 40 MG tablet Take 40 mg by mouth  daily. 04/20/14  Yes Historical Provider, MD  dextromethorphan-guaiFENesin (MUCINEX DM) 30-600 MG per 12 hr tablet Take 1 tablet by mouth every morning.   Yes Historical Provider, MD  donepezil (ARICEPT) 10 MG tablet Take 5 mg by mouth 2 (two) times daily.  05/19/14  Yes Historical Provider, MD  ibuprofen (ADVIL,MOTRIN) 200 MG tablet Take 200 mg by mouth at bedtime.   Yes Historical Provider, MD  donepezil (ARICEPT) 5 MG tablet Take 1 tablet (5 mg total) by mouth AC breakfast. Patient not taking: Reported on 06/10/2014 03/25/14   Claretta Fraise, MD  levothyroxine (SYNTHROID, LEVOTHROID) 50 MCG tablet Take 1 tablet (50 mcg total) by mouth daily. Patient not taking: Reported on 06/10/2014 03/26/14   Claretta Fraise, MD   BP 127/92 mmHg  Pulse 75  Temp(Src) 98.2 F (36.8 C) (Oral)  Resp 16  Ht 5\' 8"  (1.727 m)  Wt 165 lb (74.844 kg)  BMI 25.09 kg/m2  SpO2 100% Physical Exam  Constitutional: He is oriented to person, place, and time. He appears well-developed. No distress.  HENT:  Head: Normocephalic and atraumatic.  Eyes: Conjunctivae and EOM are normal.  Cardiovascular: Normal rate and regular rhythm.   Pulmonary/Chest: Effort normal. No stridor. No respiratory distress.  Abdominal: He exhibits no distension.  Tender to palpation in the epigastrium.  Musculoskeletal: He exhibits no edema.  Neurological: He is alert and oriented to person, place, and time.  Skin: Skin is warm  and dry.  Psychiatric: His speech is delayed. He is slowed. Cognition and memory are impaired.  Nursing note and vitals reviewed.   ED Course  Procedures (including critical care time) Labs Review Labs Reviewed  CBC WITH DIFFERENTIAL/PLATELET - Abnormal; Notable for the following:    Platelets 144 (*)    All other components within normal limits  COMPREHENSIVE METABOLIC PANEL - Abnormal; Notable for the following:    BUN 37 (*)    Creatinine, Ser 1.75 (*)    ALT 16 (*)    GFR calc non Af Amer 33 (*)    GFR calc  Af Amer 38 (*)    All other components within normal limits  URINALYSIS, ROUTINE W REFLEX MICROSCOPIC - Abnormal; Notable for the following:    Hgb urine dipstick TRACE (*)    Leukocytes, UA TRACE (*)    All other components within normal limits  LIPASE, BLOOD  URINE MICROSCOPIC-ADD ON    Imaging Review Ct Abdomen Pelvis Wo Contrast  06/10/2014   CLINICAL DATA:  Abdominal pain, constipation, nausea for several weeks, nonspecific abdominal pain  EXAM: CT ABDOMEN AND PELVIS WITHOUT CONTRAST  TECHNIQUE: Multidetector CT imaging of the abdomen and pelvis was performed following the standard protocol without IV contrast.  COMPARISON:  08/25/2010  FINDINGS: The lung bases are unremarkable. Sagittal images of the spine shows diffuse osteopenia. Mild degenerative changes lower thoracic spine. Disc space flattening with vacuum disc phenomenon at L5-S1 level. Small hiatal hernia.  Unenhanced liver shows no biliary ductal dilatation. The patient is status postcholecystectomy. There is no gastric outlet obstruction. Unenhanced pancreas, spleen and adrenal glands are unremarkable. Bilateral renal cortical thinning. There is lobulated renal contour bilaterally probable due to atrophy. At least 2 nonobstructive calculi are noted within right kidney the largest in midpole measures 9 mm. Nonobstructive calcified calculus in upper pole of the left kidney measures 5 mm. There is a cyst in midpole of the right kidney measures 9.5 mm. Multiple cysts are noted in mid and lower pole of the left kidney the largest in lower pole measures 3 cm.  No hydronephrosis or hydroureter. No calcified ureteral calculi are noted bilaterally. Atherosclerotic calcifications of abdominal aorta and iliac arteries. No aortic aneurysm.  Oral contrast material was given to the patient. There is no small bowel obstruction. No ascites or free air. Small umbilical hernia containing fat without evidence of acute complication. There is no adenopathy.   No pericecal inflammation. Normal appendix noted in axial image 50. Colonic diverticula are noted right colon and transverse colon. Scattered diverticula are noted descending colon and proximal sigmoid colon. There is no evidence of acute diverticulitis. No evidence of colonic obstruction. Contrast material noted in distal sigmoid colon and rectum. There is some stool within rectum. Radiation seeds are noted in prostatic bed region. Bilateral distal ureter is unremarkable. The urinary bladder is unremarkable. A right inguinal lymph node measures 1.5 by 1 cm. Left inguinal lymph node measures 1.1 by 1 cm.  There is a left inguinal scrotal canal hernia containing fat measures 2.7 cm without evidence of acute complication.  IMPRESSION: 1. Small hiatal hernia is noted. No evidence of gastric outlet obstruction. 2. Bilateral nonobstructive nephrolithiasis. No hydronephrosis or hydroureter. Bilateral renal atrophy with cortical thinning. Bilateral renal cysts as described above. 3. Colonic diverticulosis without evidence of acute diverticulitis. Normal appendix. No pericecal inflammation. 4. Radiation seeds are noted in prostate gland region. 5. No small bowel obstruction.  No ascites or free air. 6. Osteopenia and  mild degenerative changes thoracolumbar spine. 7. No significant colonic stool or colonic obstruction. There is some stool within rectum. Borderline inguinal lymph nodes.   Electronically Signed   By: Lahoma Crocker M.D.   On: 06/10/2014 20:43   I reviewed the results (including imaging as performed), agree with the interpretation  On repeat exam the patient appears better.  We reviewed all findings.   MDM   Elderly male presents with ongoing nausea, abdominal pain. Here the patient is awake, alert and hemodynamically stable. Patient does have tenderness in the upper abdomen, and given his description of new by mouth intolerance, CT scans performed to exclude obstruction, or other acute  pathology. Studies were largely reassuring, though the patient does have decreased renal function. Patient improved her with fluid resuscitation, antiemetics. I had a lengthy conversation with the patient and his daughter about possible causes, including medication interactions, gastritis, renal dysfunction. Patient was started on medication for nausea control, follow up with primary care in 4 days for repeat evaluation    Carmin Muskrat, MD 06/10/14 2121

## 2014-06-10 NOTE — Discharge Instructions (Signed)
As discussed, your evaluation today has been largely reassuring.  But, it is important that you monitor your condition carefully, and do not hesitate to return to the ED if you develop new, or concerning changes in your condition. ° °Otherwise, please follow-up with your physician for appropriate ongoing care. ° ° °Abdominal Pain °Many things can cause abdominal pain. Usually, abdominal pain is not caused by a disease and will improve without treatment. It can often be observed and treated at home. Your health care provider will do a physical exam and possibly order blood tests and X-rays to help determine the seriousness of your pain. However, in many cases, more time must pass before a clear cause of the pain can be found. Before that point, your health care provider may not know if you need more testing or further treatment. °HOME CARE INSTRUCTIONS  °Monitor your abdominal pain for any changes. The following actions may help to alleviate any discomfort you are experiencing: °· Only take over-the-counter or prescription medicines as directed by your health care provider. °· Do not take laxatives unless directed to do so by your health care provider. °· Try a clear liquid diet (broth, tea, or water) as directed by your health care provider. Slowly move to a bland diet as tolerated. °SEEK MEDICAL CARE IF: °· You have unexplained abdominal pain. °· You have abdominal pain associated with nausea or diarrhea. °· You have pain when you urinate or have a bowel movement. °· You experience abdominal pain that wakes you in the night. °· You have abdominal pain that is worsened or improved by eating food. °· You have abdominal pain that is worsened with eating fatty foods. °· You have a fever. °SEEK IMMEDIATE MEDICAL CARE IF:  °· Your pain does not go away within 2 hours. °· You keep throwing up (vomiting). °· Your pain is felt only in portions of the abdomen, such as the right side or the left lower portion of the  abdomen. °· You pass bloody or black tarry stools. °MAKE SURE YOU: °· Understand these instructions.   °· Will watch your condition.   °· Will get help right away if you are not doing well or get worse.   °Document Released: 10/25/2004 Document Revised: 01/20/2013 Document Reviewed: 09/24/2012 °[employer]® Patient Information ©2015 [employer]. This information is not intended to replace advice given to you by your health care provider. Make sure you discuss any questions you have with your health care provider. ° °

## 2014-06-10 NOTE — ED Notes (Signed)
Pt drinking po contrast

## 2014-06-10 NOTE — ED Notes (Signed)
Family states pt has has nausea for a long time. Complain of abdominal pain

## 2016-05-31 ENCOUNTER — Encounter: Payer: Self-pay | Admitting: Family Medicine

## 2016-05-31 ENCOUNTER — Ambulatory Visit (INDEPENDENT_AMBULATORY_CARE_PROVIDER_SITE_OTHER): Payer: Medicare Other | Admitting: Family Medicine

## 2016-05-31 VITALS — BP 128/72 | HR 76 | Temp 97.0°F | Ht 67.0 in | Wt 177.0 lb

## 2016-05-31 DIAGNOSIS — F0280 Dementia in other diseases classified elsewhere without behavioral disturbance: Secondary | ICD-10-CM | POA: Diagnosis not present

## 2016-05-31 DIAGNOSIS — G309 Alzheimer's disease, unspecified: Secondary | ICD-10-CM

## 2016-05-31 DIAGNOSIS — R202 Paresthesia of skin: Secondary | ICD-10-CM

## 2016-05-31 DIAGNOSIS — I10 Essential (primary) hypertension: Secondary | ICD-10-CM | POA: Diagnosis not present

## 2016-05-31 LAB — URINALYSIS
Bilirubin, UA: NEGATIVE
Glucose, UA: NEGATIVE
KETONES UA: NEGATIVE
Nitrite, UA: NEGATIVE
Specific Gravity, UA: 1.025 (ref 1.005–1.030)
Urobilinogen, Ur: 0.2 mg/dL (ref 0.2–1.0)
pH, UA: 6 (ref 5.0–7.5)

## 2016-05-31 MED ORDER — GABAPENTIN 300 MG PO CAPS: 300.0000 mg | ORAL_CAPSULE | Freq: Every day | ORAL | 2 refills | Status: DC

## 2016-05-31 MED ORDER — RIVASTIGMINE 4.6 MG/24HR TD PT24: 4.6000 mg | MEDICATED_PATCH | Freq: Every day | TRANSDERMAL | 12 refills | Status: DC

## 2016-05-31 NOTE — Progress Notes (Signed)
Subjective:  Patient ID: Kevin Nunez, male    DOB: 12-May-1921  Age: 83 y.o. MRN: [id number]  CC: Follow-up (pt here today transferring care from [facility] and needs refills on his Allopurinol)   HPI Kevin Nunez presents for New patient evaluation. He has been treated in the past for gout. He just needs to have his allopurinol refill. He has no current outbreak but is concerned that his symptoms will come back. Family members actually give history today. They noted that he's had confusion he is forgetful. He repeats himself. His thought processes are not as clear as they should be. He is having visual flu scintillations and delusions of people being present and performing random activities. He has had several falls over the last year. He complains of burning in his feet. Family is concerned for his blood pressure having been low. The question the need for him to continue donepezil. There is a question of hypothyroidism noted and he has been given levothyroxine in the past. Family declines prescription for that today.  History Kevin Nunez has no past medical history on file.   He has a past surgical history that includes Cholecystectomy.   His family history is not on file.He reports that he has never smoked. He has never used smokeless tobacco. He reports that he does not drink alcohol or use drugs.  Current Outpatient Prescriptions on File Prior to Visit  Medication Sig Dispense Refill  . allopurinol (ZYLOPRIM) 100 MG tablet Take 100 mg by mouth daily.     No current facility-administered medications on file prior to visit.     ROS Review of Systems  Constitutional: Negative for chills, diaphoresis, fever and unexpected weight change.  HENT: Negative for congestion, hearing loss, rhinorrhea and sore throat.   Eyes: Negative for visual disturbance.  Respiratory: Negative for cough and shortness of breath.   Cardiovascular: Negative for chest pain.  Gastrointestinal: Negative for  abdominal pain, constipation and diarrhea.  Genitourinary: Negative for dysuria and flank pain.  Musculoskeletal: Negative for arthralgias and joint swelling.  Skin: Negative for rash.  Neurological: Negative for dizziness and headaches.  Psychiatric/Behavioral: Positive for agitation, behavioral problems, confusion, decreased concentration and hallucinations. Negative for dysphoric mood and sleep disturbance.    Objective:  BP 128/72   Pulse 76   Temp 97 F (36.1 C) (Oral)   Ht '5\' 7"'  (1.702 m)   Wt 177 lb (80.3 kg)   BMI 27.72 kg/m   Physical Exam  Constitutional: He appears well-developed and well-nourished. No distress.  HENT:  Head: Normocephalic and atraumatic.  Right Ear: External ear normal.  Left Ear: External ear normal.  Nose: Nose normal.  Mouth/Throat: Oropharynx is clear and moist.  Eyes: Conjunctivae and EOM are normal. Pupils are equal, round, and reactive to light.  Neck: Normal range of motion. Neck supple. No thyromegaly present.  Cardiovascular: Normal rate and regular rhythm.   Murmur heard. Pulmonary/Chest: Effort normal and breath sounds normal. No respiratory distress. He has no wheezes. He has no rales.  Abdominal: Soft. Bowel sounds are normal. He exhibits no distension. There is no tenderness.  Lymphadenopathy:    He has no cervical adenopathy.  Neurological: He is alert. He has normal reflexes. He exhibits abnormal muscle tone (Symmetric weakness).  Skin: Skin is warm and dry.  Psychiatric: His affect is blunt. His speech is delayed, tangential and slurred. He is slowed and withdrawn. Thought content is delusional. Cognition and memory are impaired. He expresses inappropriate judgment. He exhibits abnormal recent  memory.    Assessment & Plan:   Kevin Nunez was seen today for follow-up.  Diagnoses and all orders for this visit:  Essential hypertension -     CBC with Differential/Platelet -     CMP14+EGFR -     Urinalysis  Alzheimer's dementia  without behavioral disturbance, unspecified timing of dementia onset -     CMP14+EGFR -     rivastigmine (EXELON) 4.6 mg/24hr; Place 1 patch (4.6 mg total) onto the skin daily. -     Urinalysis  Paresthesias -     gabapentin (NEURONTIN) 300 MG capsule; Take 1 capsule (300 mg total) by mouth at bedtime.  Other orders -     Cancel: Uric acid   I have discontinued Kevin Nunez's donepezil, levothyroxine, donepezil, benazepril, dextromethorphan-guaiFENesin, ondansetron, and omeprazole. I am also having him start on rivastigmine and gabapentin. Additionally, I am having him maintain his allopurinol, Melatonin, (VITAMIN D, CHOLECALCIFEROL, PO), ibuprofen, and ([place], [place] asiatica, (GOTU KOLA PO)).  Meds ordered this encounter  Medications  . Melatonin 10 MG TABS    Sig: Take 10 mg by mouth at bedtime.  Marland Kitchen VITAMIN D, CHOLECALCIFEROL, PO    Sig: Take by mouth.  Marland Kitchen ibuprofen (ADVIL,MOTRIN) 200 MG tablet    Sig: Take 200 mg by mouth every 6 (six) hours as needed.  . Gotu Kola, [place] asiatica, (GOTU KOLA PO)    Sig: Take by mouth.  . rivastigmine (EXELON) 4.6 mg/24hr    Sig: Place 1 patch (4.6 mg total) onto the skin daily.    Dispense:  30 patch    Refill:  12  . gabapentin (NEURONTIN) 300 MG capsule    Sig: Take 1 capsule (300 mg total) by mouth at bedtime.    Dispense:  30 capsule    Refill:  2     Follow-up: Return in about 6 weeks (around 07/12/2016).  Claretta Fraise, M.D.

## 2016-06-01 LAB — CBC WITH DIFFERENTIAL/PLATELET
BASOS: 0 %
Basophils Absolute: 0 10*3/uL (ref 0.0–0.2)
EOS (ABSOLUTE): 0.1 10*3/uL (ref 0.0–0.4)
EOS: 2 %
HEMATOCRIT: 42.9 % (ref 37.5–51.0)
Hemoglobin: 13.8 g/dL (ref 13.0–17.7)
IMMATURE GRANULOCYTES: 0 %
Immature Grans (Abs): 0 10*3/uL (ref 0.0–0.1)
LYMPHS ABS: 1.2 10*3/uL (ref 0.7–3.1)
Lymphs: 24 %
MCH: 29.1 pg (ref 26.6–33.0)
MCHC: 32.2 g/dL (ref 31.5–35.7)
MCV: 90 fL (ref 79–97)
MONOCYTES: 14 %
MONOS ABS: 0.7 10*3/uL (ref 0.1–0.9)
NEUTROS PCT: 60 %
Neutrophils Absolute: 3.1 10*3/uL (ref 1.4–7.0)
PLATELETS: 165 10*3/uL (ref 150–379)
RBC: 4.75 x10E6/uL (ref 4.14–5.80)
RDW: 13.1 % (ref 12.3–15.4)
WBC: 5.2 10*3/uL (ref 3.4–10.8)

## 2016-06-01 LAB — CMP14+EGFR
ALT: 8 IU/L (ref 0–44)
AST: 16 IU/L (ref 0–40)
Albumin/Globulin Ratio: 1.9 (ref 1.2–2.2)
Albumin: 4.2 g/dL (ref 3.2–4.6)
Alkaline Phosphatase: 66 IU/L (ref 39–117)
BILIRUBIN TOTAL: 0.5 mg/dL (ref 0.0–1.2)
BUN/Creatinine Ratio: 14 (ref 10–24)
BUN: 24 mg/dL (ref 10–36)
CHLORIDE: 104 mmol/L (ref 96–106)
CO2: 27 mmol/L (ref 18–29)
Calcium: 9.6 mg/dL (ref 8.6–10.2)
Creatinine, Ser: 1.77 mg/dL — ABNORMAL HIGH (ref 0.76–1.27)
GFR calc non Af Amer: 33 mL/min/{1.73_m2} — ABNORMAL LOW (ref >59)
GFR, EST AFRICAN AMERICAN: 38 mL/min/{1.73_m2} — AB (ref >59)
GLOBULIN, TOTAL: 2.2 g/dL (ref 1.5–4.5)
Glucose: 100 mg/dL — ABNORMAL HIGH (ref 65–99)
Potassium: 4.9 mmol/L (ref 3.5–5.2)
Sodium: 145 mmol/L — ABNORMAL HIGH (ref 134–144)
Total Protein: 6.4 g/dL (ref 6.0–8.5)

## 2016-12-05 ENCOUNTER — Ambulatory Visit (INDEPENDENT_AMBULATORY_CARE_PROVIDER_SITE_OTHER): Payer: Medicare Other | Admitting: Family Medicine

## 2016-12-05 ENCOUNTER — Encounter: Payer: Self-pay | Admitting: Family Medicine

## 2016-12-05 ENCOUNTER — Encounter (INDEPENDENT_AMBULATORY_CARE_PROVIDER_SITE_OTHER): Payer: Self-pay

## 2016-12-05 VITALS — BP 146/83 | HR 84 | Temp 97.6°F | Ht 67.0 in | Wt 173.0 lb

## 2016-12-05 DIAGNOSIS — H60392 Other infective otitis externa, left ear: Secondary | ICD-10-CM

## 2016-12-05 DIAGNOSIS — Z23 Encounter for immunization: Secondary | ICD-10-CM

## 2016-12-05 MED ORDER — ALLOPURINOL 100 MG PO TABS: 100.0000 mg | ORAL_TABLET | Freq: Every day | ORAL | 1 refills | Status: DC

## 2016-12-05 MED ORDER — CIPROFLOXACIN-HYDROCORTISONE 0.2-1 % OT SUSP: 3.0000 [drp] | Freq: Two times a day (BID) | OTIC | 0 refills | Status: DC

## 2016-12-05 NOTE — Progress Notes (Signed)
Chief Complaint  Patient presents with  . Ear Pain    pt here today c/o left ear pain     HPI  Patient presents today for increasing pain on the left side for the last few days.  He is hard of hearing.  No change has been noted by his daughter who accompanies him.  He has not had any congestion or cough or fever.  PMH: Smoking status noted ROS: Per HPI  Objective: BP (!) 146/83   Pulse 84   Temp 97.6 F (36.4 C) (Oral)   Ht 5\' 7"  (1.702 m)   Wt 173 lb (78.5 kg)   BMI 27.10 kg/m  Gen: NAD, alert, cooperative with exam HEENT: NCAT, EOMI, PERRL TMs occluded by cerumen bilaterally. After lavage and removal of remaining wax with cerumen curette, left ear canal is swollen, red and tender. CV: RRR, good S1/S2, no murmur  Ext: No edema, warm Neuro: Alert and oriented, No gross deficits  Assessment and plan:  1. Other infective acute otitis externa of left ear   2. Encounter for immunization     Meds ordered this encounter  Medications  . allopurinol (ZYLOPRIM) 100 MG tablet    Sig: Take 1 tablet (100 mg total) daily by mouth.    Dispense:  90 tablet    Refill:  1  . ciprofloxacin-hydrocortisone (CIPRO HC OTIC) OTIC suspension    Sig: Place 3 drops 2 (two) times daily into both ears. For one week    Dispense:  10 mL    Refill:  0    Orders Placed This Encounter  Procedures  . Flu vaccine HIGH DOSE PF    Follow up as needed.  Claretta Fraise, MD

## 2016-12-24 ENCOUNTER — Telehealth: Payer: Self-pay | Admitting: Family Medicine

## 2016-12-24 NOTE — Telephone Encounter (Signed)
Needs to be seen, needs to have oxygen levels checked, may need xray

## 2016-12-25 ENCOUNTER — Ambulatory Visit: Payer: Medicare Other | Admitting: Family Medicine

## 2016-12-25 ENCOUNTER — Ambulatory Visit (INDEPENDENT_AMBULATORY_CARE_PROVIDER_SITE_OTHER): Payer: Medicare Other | Admitting: Nurse Practitioner

## 2016-12-25 ENCOUNTER — Encounter: Payer: Self-pay | Admitting: Nurse Practitioner

## 2016-12-25 VITALS — BP 114/67 | HR 105 | Temp 96.7°F

## 2016-12-25 DIAGNOSIS — R05 Cough: Secondary | ICD-10-CM

## 2016-12-25 DIAGNOSIS — R059 Cough, unspecified: Secondary | ICD-10-CM

## 2016-12-25 MED ORDER — AZITHROMYCIN 250 MG PO TABS: ORAL_TABLET | ORAL | 0 refills | Status: DC

## 2016-12-25 MED ORDER — BENZONATATE 100 MG PO CAPS: 100.0000 mg | ORAL_CAPSULE | Freq: Three times a day (TID) | ORAL | 0 refills | Status: DC | PRN

## 2016-12-25 NOTE — Patient Instructions (Signed)

## 2016-12-25 NOTE — Progress Notes (Signed)
   Subjective:    Patient ID: Kevin Nunez, male    DOB: Jan 30, 1922, 83 y.o.   MRN: [id number]  HPI Patient brought in by his wife and granddaughter. He has been c/o cough and wheezing and poor appetite. Started last Friday. Has been giving him alka selter could and cough.    Review of Systems  Constitutional: Negative for chills and fever.  HENT: Positive for congestion, rhinorrhea and sinus pressure. Negative for ear discharge, ear pain and trouble swallowing.   Respiratory: Positive for cough.   Cardiovascular: Negative.   Gastrointestinal: Negative.   Genitourinary: Negative.   Neurological: Negative.   Psychiatric/Behavioral: Negative.   All other systems reviewed and are negative.      Objective:   Physical Exam  Constitutional: He is oriented to person, place, and time. He appears well-developed and well-nourished. No distress.  HENT:  Right Ear: Hearing, tympanic membrane, external ear and ear canal normal.  Left Ear: Hearing, tympanic membrane, external ear and ear canal normal.  Nose: Mucosal edema and rhinorrhea present. Right sinus exhibits no maxillary sinus tenderness and no frontal sinus tenderness. Left sinus exhibits no maxillary sinus tenderness and no frontal sinus tenderness.  Mouth/Throat: Uvula is midline, oropharynx is clear and moist and mucous membranes are normal.  Cardiovascular: Normal rate.  Pulmonary/Chest: Effort normal and breath sounds normal.  Diminished breath sounds in bil lower lobes  Neurological: He is alert and oriented to person, place, and time.   BP 114/67   Pulse (!) 105   Temp (!) 96.7 F (35.9 C) (Oral)        Assessment & Plan:  .1. Cough 1. Take meds as prescribed 2. Use a cool mist humidifier especially during the winter months and when heat has been humid. 3. Use saline nose sprays frequently 4. Saline irrigations of the nose can be very helpful if done frequently.  * 4X daily for 1 week*  * Use of a nettie pot can  be helpful with this. Follow directions with this* 5. Drink plenty of fluids 6. Keep thermostat turn down low 7.For any cough or congestion  Use plain Mucinex- regular strength or max strength is fine   * Children- consult with Pharmacist for dosing 8. For fever or aces or pains- take tylenol or ibuprofen appropriate for age and weight.  * for fevers greater than 101 orally you may alternate ibuprofen and tylenol every  3 hours.   Meds ordered this encounter  Medications  . azithromycin (ZITHROMAX Z-PAK) 250 MG tablet    Sig: As directed    Dispense:  6 tablet    Refill:  0    Order Specific Question:   Supervising Provider    Answer:   VINCENT, CAROL L [[id number]]  . benzonatate (TESSALON PERLES) 100 MG capsule    Sig: Take 1 capsule (100 mg total) by mouth 3 (three) times daily as needed for cough.    Dispense:  20 capsule    Refill:  0    Order Specific Question:   Supervising Provider    Answer:   Eustaquio Maize [place], FNP

## 2016-12-28 ENCOUNTER — Other Ambulatory Visit: Payer: Self-pay | Admitting: Family Medicine

## 2016-12-28 DIAGNOSIS — R202 Paresthesia of skin: Secondary | ICD-10-CM

## 2017-01-01 ENCOUNTER — Encounter: Payer: Self-pay | Admitting: Family Medicine

## 2017-01-01 ENCOUNTER — Ambulatory Visit (INDEPENDENT_AMBULATORY_CARE_PROVIDER_SITE_OTHER): Payer: Medicare Other | Admitting: Family Medicine

## 2017-01-01 VITALS — BP 115/68 | HR 92 | Temp 97.2°F | Ht 67.0 in | Wt 168.0 lb

## 2017-01-01 DIAGNOSIS — G309 Alzheimer's disease, unspecified: Secondary | ICD-10-CM | POA: Diagnosis not present

## 2017-01-01 DIAGNOSIS — F0280 Dementia in other diseases classified elsewhere without behavioral disturbance: Secondary | ICD-10-CM

## 2017-01-01 DIAGNOSIS — I10 Essential (primary) hypertension: Secondary | ICD-10-CM

## 2017-01-01 MED ORDER — RIVASTIGMINE 9.5 MG/24HR TD PT24: 9.5000 mg | MEDICATED_PATCH | Freq: Every day | TRANSDERMAL | 1 refills | Status: DC

## 2017-01-06 NOTE — Progress Notes (Signed)
Subjective:  Patient ID: Kevin Nunez, male    DOB: 08-12-1921  Age: 83 y.o. MRN: [id number]  CC: Hypertension (pt here today for routine follow up of his chronic medical conditions and also needs to discuss getting a letter for the [place] so he can get someone to sit with him)   HPI Kevin Nunez presents for follow-up of hypertension. Patient has no history of headache chest pain or shortness of breath or recent cough. Patient also denies symptoms of TIA such as numbness weakness lateralizing.  Patient also he has normal history.  His daughter is present and give [place] much history.  Patient denies side effects from his medication. States taking it regularly.  He is suffering from dementia and she is his primary caregiver.  However she is going through a great deal of illness for himself and due to her own weakness and debility she is no longer able to give him the care that he requires.  He is currently taking Exelon 4.6 mg patch.  Unfortunately he is having problems with short-term memory and cognition.  Additionally he has very little ability to perform his ADLs and understand basic instructions and follow his own needs.  His caregiver currently liver currently is providing his meals bathing him shaving him giving him his medications.  He wets as well as himself routinely.  He struggles with putting on his own pull-ups which are required for personal sanitation.  He is frequently too weak to get to the toilet and does not always realize when he needs to go anyway.  He is having problems with being awake at night and sleeping during the day.  This makes it difficult for the caregiver to monitor him and to get adequate sleep herself.  They need assistance in the home.  The granddaughter has been looking into the [employer] "aide and attendance program."  For that may need a letter from a physician stating that he has.     Depression screen [facility] 2/9 01/01/2017 12/25/2016 12/25/2016  Decreased Interest 0 0 0    Down, Depressed, Hopeless 0 0 0  PHQ - 2 Score 0 0 0    History Kevin Nunez has no past medical history on file.   He has a past surgical history that includes Cholecystectomy.   His family history is not on file.He reports that  has never smoked. he has never used smokeless tobacco. He reports that he does not drink alcohol or use drugs.    ROS Review of Systems  Unable to perform ROS: Dementia (Patient has dementia)    Objective:  BP 115/68   Pulse 92   Temp (!) 97.2 F (36.2 C) (Oral)   Ht 5\' 7"  (1.702 m)   Wt 168 lb (76.2 kg)   BMI 26.31 kg/m   BP Readings from Last 3 Encounters:  01/01/17 115/68  12/25/16 114/67  12/05/16 (!) 146/83    Wt Readings from Last 3 Encounters:  01/01/17 168 lb (76.2 kg)  12/05/16 173 lb (78.5 kg)  05/31/16 177 lb (80.3 kg)     Physical Exam  Constitutional: He is oriented to person, place, and time. He appears well-developed and well-nourished. No distress.  HENT:  Head: Normocephalic and atraumatic.  Right Ear: External ear normal.  Left Ear: External ear normal.  Nose: Nose normal.  Mouth/Throat: Oropharynx is clear and moist.  Eyes: Conjunctivae and EOM are normal. Pupils are equal, round, and reactive to light.  Neck: Normal range of motion. Neck supple. No  thyromegaly present.  Cardiovascular: Normal rate, regular rhythm and normal heart sounds.  No murmur heard. Pulmonary/Chest: Effort normal and breath sounds normal. No respiratory distress. He has no wheezes. He has no rales.  Abdominal: Soft. Bowel sounds are normal. He exhibits no distension. There is no tenderness.  Lymphadenopathy:    He has no cervical adenopathy.  Neurological: He is alert and oriented to person, place, and time. He has normal reflexes.  Skin: Skin is warm and dry.  Psychiatric: His affect is labile. His speech is delayed. He is slowed. Cognition and memory are impaired. He expresses inappropriate judgment.      Assessment & Plan:   Kevin Nunez  was seen today for hypertension.  Diagnoses and all orders for this visit:  Alzheimer's dementia without behavioral disturbance, unspecified timing of dementia onset  Essential hypertension  Other orders -     rivastigmine (EXELON) 9.5 mg/24hr; Place 1 patch (9.5 mg total) onto the skin daily.       I have discontinued Kevin Nunez's Melatonin, rivastigmine, ciprofloxacin-hydrocortisone, and azithromycin. I am also having him start on rivastigmine. Additionally, I am having him maintain his ibuprofen, ([place], [place] asiatica, (GOTU KOLA PO)), allopurinol, benzonatate, and gabapentin.  Allergies as of 01/01/2017      Reactions   Codeine Other (See Comments)   Altered mental status      Medication List        Accurate as of 01/01/17 11:59 PM. Always use your most recent med list.          allopurinol 100 MG tablet Commonly known as:  ZYLOPRIM Take 1 tablet (100 mg total) daily by mouth.   benzonatate 100 MG capsule Commonly known as:  TESSALON PERLES Take 1 capsule (100 mg total) by mouth 3 (three) times daily as needed for cough.   gabapentin 300 MG capsule Commonly known as:  NEURONTIN TAKE 1 CAPSULE BY MOUTH ONCE DAILY AT BEDTIME   GOTU KOLA PO Take by mouth.   ibuprofen 200 MG tablet Commonly known as:  ADVIL,MOTRIN Take 200 mg by mouth every 6 (six) hours as needed.   rivastigmine 9.5 mg/24hr Commonly known as:  EXELON Place 1 patch (9.5 mg total) onto the skin daily.        Follow-up: Return in about 1 month (around 02/01/2017).  Claretta Fraise, M.D.

## 2017-02-12 ENCOUNTER — Ambulatory Visit: Payer: Medicare Other | Admitting: Family Medicine

## 2017-03-13 ENCOUNTER — Other Ambulatory Visit: Payer: Self-pay | Admitting: Family Medicine

## 2017-04-02 ENCOUNTER — Ambulatory Visit: Payer: Medicare Other | Admitting: Family Medicine

## 2017-04-17 ENCOUNTER — Ambulatory Visit (INDEPENDENT_AMBULATORY_CARE_PROVIDER_SITE_OTHER): Payer: Medicare Other | Admitting: Family Medicine

## 2017-04-17 ENCOUNTER — Encounter: Payer: Self-pay | Admitting: Family Medicine

## 2017-04-17 VITALS — BP 120/71 | HR 90 | Temp 97.4°F | Ht 66.0 in | Wt 170.0 lb

## 2017-04-17 DIAGNOSIS — R109 Unspecified abdominal pain: Secondary | ICD-10-CM | POA: Diagnosis not present

## 2017-04-17 DIAGNOSIS — N3000 Acute cystitis without hematuria: Secondary | ICD-10-CM | POA: Diagnosis not present

## 2017-04-17 LAB — MICROSCOPIC EXAMINATION

## 2017-04-17 LAB — URINALYSIS, COMPLETE
Bilirubin, UA: NEGATIVE
GLUCOSE, UA: NEGATIVE
Nitrite, UA: NEGATIVE
PROTEIN UA: NEGATIVE
RBC, UA: NEGATIVE
SPEC GRAV UA: 1.02 (ref 1.005–1.030)
Urobilinogen, Ur: 0.2 mg/dL (ref 0.2–1.0)
pH, UA: 5.5 (ref 5.0–7.5)

## 2017-04-17 MED ORDER — CEPHALEXIN 250 MG PO CAPS: 250.0000 mg | ORAL_CAPSULE | Freq: Three times a day (TID) | ORAL | 0 refills | Status: AC

## 2017-04-17 NOTE — Progress Notes (Signed)
Subjective: CC: UTi PCP: Claretta Fraise, MD Kevin Nunez is a 84 y.o. male presenting to clinic today for:  1. Urinary symptoms Patient is brought to appointment by his daughter who notes that he has been having decreased alertness and decreased energy over the last week and a half.  Patient notes increased urinary frequency.  He reports chronic back pain.  No new onset thoracic back pain.  Denies fevers, chills, nausea, vomiting, hematuria.  No history of UTI in the past.  He does have a history of renal stones but notes that this does not feel like that.    ROS: Per HPI  Allergies  Allergen Reactions  . Codeine Other (See Comments)    Altered mental status   No past medical history on file.  Current Outpatient Medications:  .  allopurinol (ZYLOPRIM) 100 MG tablet, Take 1 tablet (100 mg total) daily by mouth., Disp: 90 tablet, Rfl: 1 .  [place], Emergency planning/management officer asiatica, (GOTU KOLA PO), Take by mouth., Disp: , Rfl:  .  ibuprofen (ADVIL,MOTRIN) 200 MG tablet, Take 200 mg by mouth every 6 (six) hours as needed., Disp: , Rfl:  .  rivastigmine (EXELON) 9.5 mg/24hr, APPLY 1 PATCH TOPICALLY ONCE DAILY, Disp: 30 patch, Rfl: 3   Social History   Socioeconomic History  . Marital status: Widowed    Spouse name: Not on file  . Number of children: Not on file  . Years of education: Not on file  . Highest education level: Not on file  Social Needs  . Financial resource strain: Not on file  . Food insecurity - worry: Not on file  . Food insecurity - inability: Not on file  . Transportation needs - medical: Not on file  . Transportation needs - non-medical: Not on file  Occupational History  . Not on file  Tobacco Use  . Smoking status: Never Smoker  . Smokeless tobacco: Never Used  Substance and Sexual Activity  . Alcohol use: No  . Drug use: No  . Sexual activity: Not Currently  Other Topics Concern  . Not on file  Social History Narrative  . Not on file   No family  history on file.  Objective: Office vital signs reviewed. BP 120/71   Pulse 90   Temp (!) 97.4 F (36.3 C) (Oral)   Ht 5\' 6"  (1.676 m)   Wt 170 lb (77.1 kg)   BMI 27.44 kg/m   Physical Examination:  General: Awake, alert, elderly, well nourished, No acute distress GU: No suprapubic tenderness to palpation.  No CVA tenderness to palpation Neuro: Alert and oriented x2 (city and person)  Assessment/ Plan: 84 y.o. male   1. Acute cystitis without hematuria Urinalysis with 1+ white blood cells.  Urine microscopy with many white blood cells and few bacteria.  Urine sent for urine culture.  Will treat with renally dosed Keflex 250 mg 3 times daily for the next 7 days for GFR of 28.  Will contact patient's daughter with results of culture once they are available.  Reasons for emergent evaluation the emergency department discussed with the daughter.  She voiced understanding will follow-up as needed. - Urinalysis, Complete - Urine Culture   Orders Placed This Encounter  Procedures  . Urine Culture  . Urinalysis, Complete   Meds ordered this encounter  Medications  . cephALEXin (KEFLEX) 250 MG capsule    Sig: Take 1 capsule (250 mg total) by mouth 3 (three) times daily for 7 days.  Dispense:  21 capsule    Refill:  [place], DO [place] [phone]

## 2017-04-17 NOTE — Patient Instructions (Signed)
His urine does seem consistent with a urinary tract infection.  I sent this off for culture.  In the meantime, I am treating him with cephalexin 3 times a day for the next week.  I will contact you with the results of the urine culture and if we need to change his antibiotic we can do that at that time.  If his symptoms worsen, he starts becoming more confused, develops high fevers, nausea, vomiting, please seek immediate medical attention in the emergency department.   Urinary Tract Infection, Adult A urinary tract infection (UTI) is an infection of any part of the urinary tract. The urinary tract includes the:  Kidneys.  Ureters.  Bladder.  Urethra.  These organs make, store, and get rid of pee (urine) in the body. Follow these instructions at home:  Take over-the-counter and prescription medicines only as told by your doctor.  If you were prescribed an antibiotic medicine, take it as told by your doctor. Do not stop taking the antibiotic even if you start to feel better.  Avoid the following drinks: ? Alcohol. ? Caffeine. ? Tea. ? Carbonated drinks.  Drink enough fluid to keep your pee clear or pale yellow.  Keep all follow-up visits as told by your doctor. This is important.  Make sure to: ? Empty your bladder often and completely. Do not to hold pee for long periods of time. ? Empty your bladder before and after sex. ? Wipe from front to back after a bowel movement if you are male. Use each tissue one time when you wipe. Contact a doctor if:  You have back pain.  You have a fever.  You feel sick to your stomach (nauseous).  You throw up (vomit).  Your symptoms do not get better after 3 days.  Your symptoms go away and then come back. Get help right away if:  You have very bad back pain.  You have very bad lower belly (abdominal) pain.  You are throwing up and cannot keep down any medicines or water. This information is not intended to replace advice given  to you by your health care provider. Make sure you discuss any questions you have with your health care provider. Document Released: 07/04/2007 Document Revised: 06/23/2015 Document Reviewed: 12/06/2014 Elsevier Interactive Patient Education  [employer].

## 2017-04-26 ENCOUNTER — Ambulatory Visit: Payer: Medicare Other | Admitting: Family Medicine

## 2017-06-30 ENCOUNTER — Other Ambulatory Visit: Payer: Self-pay | Admitting: Family Medicine

## 2017-07-01 ENCOUNTER — Encounter: Payer: Self-pay | Admitting: Family Medicine

## 2017-07-01 ENCOUNTER — Ambulatory Visit (INDEPENDENT_AMBULATORY_CARE_PROVIDER_SITE_OTHER): Payer: Medicare Other

## 2017-07-01 ENCOUNTER — Ambulatory Visit (INDEPENDENT_AMBULATORY_CARE_PROVIDER_SITE_OTHER): Payer: Medicare Other | Admitting: Family Medicine

## 2017-07-01 VITALS — BP 137/75 | HR 80 | Temp 97.8°F | Ht 66.0 in | Wt 165.5 lb

## 2017-07-01 DIAGNOSIS — R2689 Other abnormalities of gait and mobility: Secondary | ICD-10-CM | POA: Diagnosis not present

## 2017-07-01 DIAGNOSIS — R062 Wheezing: Secondary | ICD-10-CM

## 2017-07-01 DIAGNOSIS — R296 Repeated falls: Secondary | ICD-10-CM

## 2017-07-01 DIAGNOSIS — R0989 Other specified symptoms and signs involving the circulatory and respiratory systems: Secondary | ICD-10-CM | POA: Diagnosis not present

## 2017-07-01 MED ORDER — LEVOFLOXACIN 500 MG PO TABS: 500.0000 mg | ORAL_TABLET | Freq: Every day | ORAL | 0 refills | Status: DC

## 2017-07-01 MED ORDER — RIVASTIGMINE 9.5 MG/24HR TD PT24: MEDICATED_PATCH | TRANSDERMAL | 3 refills | Status: DC

## 2017-07-01 MED ORDER — ALBUTEROL SULFATE (2.5 MG/3ML) 0.083% IN NEBU: 2.5000 mg | INHALATION_SOLUTION | Freq: Four times a day (QID) | RESPIRATORY_TRACT | 0 refills | Status: AC | PRN

## 2017-07-01 NOTE — Progress Notes (Signed)
Your chest x-ray looked normal. Thanks, WS.

## 2017-07-01 NOTE — Patient Instructions (Signed)
Use Tylenol PM for sleep

## 2017-07-01 NOTE — Progress Notes (Signed)
Subjective:  Patient ID: Kevin Nunez, male    DOB: Jan 23, 1922  Age: 84 y.o. MRN: [id number]  CC: Cough (pt here today c/o cough and congestion)   HPI Kevin Nunez presents for multiple episodes of falling increasing recently.  His daughter is here with him and states that he is fallen about 6 times over the last 1 to 2 weeks.  Although it is not been seriously injured there is significant concern about this happening.  This is also limiting his ability to perform ADLs and to ambulate independently around the home.  He has this pastor adjacent to the home he is used to walking into this for some recreation and exercise and he has fallen several times walking up to the pasture.  Additionally for the last 3 days he has had an increasing cough.  It is moderately severe in character and frequency.  It has been productive of some purulent sputum.  There has been no fever chills sweats.  However he has noted some shortness of breath although it has not been severe enough to limit him as yet.  Appetite is fair.  Depression screen [facility] 2/9 01/01/2017 12/25/2016 12/25/2016  Decreased Interest 0 0 0  Down, Depressed, Hopeless 0 0 0  PHQ - 2 Score 0 0 0    History Kevin Nunez has no past medical history on file.   He has a past surgical history that includes Cholecystectomy.   His family history is not on file.He reports that he has never smoked. He has never used smokeless tobacco. He reports that he does not drink alcohol or use drugs.    ROS Review of Systems  Constitutional: Positive for activity change and fatigue. Negative for fever.  HENT: Positive for congestion and rhinorrhea. Negative for ear pain, sore throat and tinnitus.   Respiratory: Positive for cough and shortness of breath.   Cardiovascular: Negative for chest pain.  Gastrointestinal: Negative.   Musculoskeletal: Positive for arthralgias.  Skin: Negative for rash.  Neurological: Positive for dizziness (Mainly poor  balance).  Psychiatric/Behavioral: Positive for sleep disturbance.    Objective:  BP 137/75   Pulse 80   Temp 97.8 F (36.6 C) (Oral)   Ht 5\' 6"  (1.676 m)   Wt 165 lb 8 oz (75.1 kg)   SpO2 96%   BMI 26.71 kg/m   BP Readings from Last 3 Encounters:  07/01/17 137/75  04/17/17 120/71  01/01/17 115/68    Wt Readings from Last 3 Encounters:  07/01/17 165 lb 8 oz (75.1 kg)  04/17/17 170 lb (77.1 kg)  01/01/17 168 lb (76.2 kg)     Physical Exam  Constitutional: He appears well-developed and well-nourished.  HENT:  Head: Normocephalic and atraumatic.  Right Ear: Tympanic membrane and external ear normal. No decreased hearing is noted.  Left Ear: Tympanic membrane and external ear normal. No decreased hearing is noted.  Mouth/Throat: No oropharyngeal exudate or posterior oropharyngeal erythema.  Eyes: Pupils are equal, round, and reactive to light.  Neck: Normal range of motion. Neck supple.  Cardiovascular: Normal rate and regular rhythm.  No murmur heard. Pulmonary/Chest: No respiratory distress. He has wheezes (scattered, breath sounds distant).  Abdominal: Soft. Bowel sounds are normal. He exhibits no mass. There is no tenderness.  Neurological: Coordination (off balnce, unsteady gait) abnormal.  Vitals reviewed.     Assessment & Plan:   [name] was seen today for cough.  Diagnoses and all orders for this visit:  Falls frequently -  Face-to-face encounter (required for Medicare/Medicaid patients)  Imbalance -     Face-to-face encounter (required for Medicare/Medicaid patients)  Wheezing -     DME Nebulizer machine -     DG Chest 2 View; Future  Other orders -     levofloxacin (LEVAQUIN) 500 MG tablet; Take 1 tablet (500 mg total) by mouth daily. -     albuterol (PROVENTIL) (2.5 MG/3ML) 0.083% nebulizer solution; Take 3 mLs (2.5 mg total) by nebulization every 6 (six) hours as needed for wheezing or shortness of breath. -     rivastigmine (EXELON) 9.5  mg/24hr; APPLY 1 PATCH TOPICALLY ONCE DAILY       I am having [name] start on levofloxacin and albuterol. I am also having him maintain his ibuprofen, ([place], [place] asiatica, (GOTU KOLA PO)), allopurinol, and rivastigmine.  Allergies as of 07/01/2017      Reactions   Codeine Other (See Comments)   Altered mental status      Medication List        Accurate as of 07/01/17  3:48 PM. Always use your most recent med list.          albuterol (2.5 MG/3ML) 0.083% nebulizer solution Commonly known as:  PROVENTIL Take 3 mLs (2.5 mg total) by nebulization every 6 (six) hours as needed for wheezing or shortness of breath.   allopurinol 100 MG tablet Commonly known as:  ZYLOPRIM TAKE 1 TABLET BY MOUTH ONCE DAILY   GOTU KOLA PO Take by mouth.   ibuprofen 200 MG tablet Commonly known as:  ADVIL,MOTRIN Take 200 mg by mouth every 6 (six) hours as needed.   levofloxacin 500 MG tablet Commonly known as:  LEVAQUIN Take 1 tablet (500 mg total) by mouth daily.   rivastigmine 9.5 mg/24hr Commonly known as:  EXELON APPLY 1 PATCH TOPICALLY ONCE DAILY            Durable Medical Equipment  (From admission, onward)        Start     Ordered   07/01/17 0000  DME Nebulizer machine    Comments:  Dx R06.2  Question:  Patient needs a nebulizer to treat with the following condition  Answer:  Wheezing   07/01/17 1533     Use Tylenol PM as needed for sleep disturbance.  Will have home health work with him regarding physical therapy to help with balance and strength to prevent falling.  Follow-up: Return in about 6 weeks (around 08/12/2017).  Claretta Fraise, M.D.

## 2017-07-02 ENCOUNTER — Telehealth: Payer: Self-pay | Admitting: Family Medicine

## 2017-07-02 DIAGNOSIS — R05 Cough: Secondary | ICD-10-CM

## 2017-07-02 DIAGNOSIS — R062 Wheezing: Secondary | ICD-10-CM

## 2017-07-02 DIAGNOSIS — R059 Cough, unspecified: Secondary | ICD-10-CM

## 2017-07-02 NOTE — Telephone Encounter (Signed)
rx for nebulizer printed and will pick up

## 2017-08-12 ENCOUNTER — Ambulatory Visit: Payer: Medicare Other | Admitting: Family Medicine

## 2017-09-20 ENCOUNTER — Encounter: Payer: Self-pay | Admitting: Family Medicine

## 2017-09-20 ENCOUNTER — Ambulatory Visit (INDEPENDENT_AMBULATORY_CARE_PROVIDER_SITE_OTHER): Payer: Medicare Other | Admitting: Family Medicine

## 2017-09-20 VITALS — BP 122/66 | HR 86 | Temp 99.1°F | Ht 66.0 in | Wt 168.1 lb

## 2017-09-20 DIAGNOSIS — R2681 Unsteadiness on feet: Secondary | ICD-10-CM

## 2017-09-20 DIAGNOSIS — I10 Essential (primary) hypertension: Secondary | ICD-10-CM | POA: Diagnosis not present

## 2017-09-20 DIAGNOSIS — F0280 Dementia in other diseases classified elsewhere without behavioral disturbance: Secondary | ICD-10-CM

## 2017-09-20 DIAGNOSIS — G309 Alzheimer's disease, unspecified: Secondary | ICD-10-CM

## 2017-09-20 NOTE — Progress Notes (Addendum)
Subjective:  Patient ID: Kevin Nunez, male    DOB: 02-17-1921  Age: 84 y.o. MRN: [id number]  CC: Medical Management of Chronic Issues   HPI Kevin Nunez presents for  follow-up of hypertension. Patient has no history of headache chest pain or shortness of breath or recent cough. Patient also denies symptoms of TIA such as focal numbness or weakness. Patient denies side effects from medication. States taking it regularly. Having continued problems with mentation, forgetful processing more slowly. Additionally off balance. Falling easily.   History Kevin Nunez has no past medical history on file.   He has a past surgical history that includes Cholecystectomy.   His family history is not on file.He reports that he has never smoked. He has never used smokeless tobacco. He reports that he does not drink alcohol or use drugs.  Current Outpatient Medications on File Prior to Visit  Medication Sig Dispense Refill  . albuterol (PROVENTIL) (2.5 MG/3ML) 0.083% nebulizer solution Take 3 mLs (2.5 mg total) by nebulization every 6 (six) hours as needed for wheezing or shortness of breath. 360 mL 0  . allopurinol (ZYLOPRIM) 100 MG tablet TAKE 1 TABLET BY MOUTH ONCE DAILY 90 tablet 1  . [place], [place] asiatica, (GOTU KOLA PO) Take by mouth.    [place] ibuprofen (ADVIL,MOTRIN) 200 MG tablet Take 200 mg by mouth every 6 (six) hours as needed.    . rivastigmine (EXELON) 9.5 mg/24hr APPLY 1 PATCH TOPICALLY ONCE DAILY 30 patch 3   No current facility-administered medications on file prior to visit.     ROS Review of Systems  Constitutional: Negative.   HENT: Negative.   Eyes: Negative for visual disturbance.  Respiratory: Negative for cough and shortness of breath.   Cardiovascular: Negative for chest pain and leg swelling.  Gastrointestinal: Negative for abdominal pain, diarrhea, nausea and vomiting.  Genitourinary: Negative for difficulty urinating.  Musculoskeletal: Positive for gait problem.  Negative for arthralgias and myalgias.  Skin: Negative for rash.  Neurological: Negative for headaches.  Psychiatric/Behavioral: Negative for sleep disturbance.    Objective:  BP 122/66   Pulse 86   Temp 99.1 F (37.3 C) (Oral)   Ht 5\' 6"  (1.676 m)   Wt 168 lb 2 oz (76.3 kg)   BMI 27.14 kg/m   BP Readings from Last 3 Encounters:  09/20/17 122/66  07/01/17 137/75  04/17/17 120/71    Wt Readings from Last 3 Encounters:  09/20/17 168 lb 2 oz (76.3 kg)  07/01/17 165 lb 8 oz (75.1 kg)  04/17/17 170 lb (77.1 kg)     Physical Exam  Psychiatric: His affect is blunt. His speech is delayed. He is slowed. Cognition and memory are impaired. He does not express impulsivity. He exhibits abnormal recent memory. He is inattentive.      Assessment & Plan:   Kevin Nunez was seen today for medical management of chronic issues.  Diagnoses and all orders for this visit:  Essential hypertension  Alzheimer's dementia without behavioral disturbance, unspecified timing of dementia onset -     Ambulatory referral to [facility]  Gait instability -     Ambulatory referral to [place] as of 09/20/2017      Reactions   Codeine Other (See Comments)   Altered mental status      Medication List        Accurate as of 09/20/17 11:59 PM. Always use your most recent med list.          albuterol (  2.5 MG/3ML) 0.083% nebulizer solution Commonly known as:  PROVENTIL Take 3 mLs (2.5 mg total) by nebulization every 6 (six) hours as needed for wheezing or shortness of breath.   allopurinol 100 MG tablet Commonly known as:  ZYLOPRIM TAKE 1 TABLET BY MOUTH ONCE DAILY   GOTU KOLA PO Take by mouth.   ibuprofen 200 MG tablet Commonly known as:  ADVIL,MOTRIN Take 200 mg by mouth every 6 (six) hours as needed.   rivastigmine 9.5 mg/24hr Commonly known as:  EXELON APPLY 1 PATCH TOPICALLY ONCE DAILY       No orders of the defined types were placed in this  encounter.     Follow-up: Return in about 6 weeks (around 11/01/2017).  Claretta Fraise, M.D.

## 2017-09-22 ENCOUNTER — Encounter: Payer: Self-pay | Admitting: Family Medicine

## 2017-09-22 DIAGNOSIS — R2681 Unsteadiness on feet: Secondary | ICD-10-CM | POA: Insufficient documentation

## 2017-09-28 DIAGNOSIS — R5381 Other malaise: Secondary | ICD-10-CM | POA: Diagnosis not present

## 2017-10-11 ENCOUNTER — Telehealth: Payer: Self-pay | Admitting: Family Medicine

## 2017-10-15 NOTE — Telephone Encounter (Signed)
Appt made for 10/22/17

## 2017-10-22 ENCOUNTER — Encounter: Payer: Self-pay | Admitting: Family Medicine

## 2017-10-22 ENCOUNTER — Ambulatory Visit (INDEPENDENT_AMBULATORY_CARE_PROVIDER_SITE_OTHER): Payer: Medicare Other | Admitting: Family Medicine

## 2017-10-22 VITALS — BP 120/72 | HR 95 | Temp 98.7°F | Ht 66.0 in | Wt 168.4 lb

## 2017-10-22 DIAGNOSIS — R2681 Unsteadiness on feet: Secondary | ICD-10-CM | POA: Diagnosis not present

## 2017-10-22 DIAGNOSIS — R29898 Other symptoms and signs involving the musculoskeletal system: Secondary | ICD-10-CM

## 2017-10-22 DIAGNOSIS — F0280 Dementia in other diseases classified elsewhere without behavioral disturbance: Secondary | ICD-10-CM | POA: Diagnosis not present

## 2017-10-22 DIAGNOSIS — G309 Alzheimer's disease, unspecified: Secondary | ICD-10-CM | POA: Diagnosis not present

## 2017-10-22 MED ORDER — RIVASTIGMINE 13.3 MG/24HR TD PT24: 13.3000 mg | MEDICATED_PATCH | Freq: Every day | TRANSDERMAL | 5 refills | Status: DC

## 2017-10-23 ENCOUNTER — Encounter: Payer: Self-pay | Admitting: Family Medicine

## 2017-10-23 NOTE — Progress Notes (Signed)
Subjective:  Patient ID: Kevin Nunez, male    DOB: 1922-01-19  Age: 84 y.o. MRN: [id number]  CC: face to face (pt here today for face to face visit for lift chair)   HPI Kevin Nunez presents for he cannot adequately lift himself up from seated to standing due to weak upper body strength.  He is unsteady as he tries to and is at risk of falling.  Due to his center of gravity he is at risk of falling forward.  Patient also has a decrease in memory according to his daughter who gives his history.  She states that his speech is declining and his thought processes are not sound. Depression screen [facility] 2/9 10/22/2017 01/01/2017 12/25/2016  Decreased Interest 0 0 0  Down, Depressed, Hopeless 0 0 0  PHQ - 2 Score 0 0 0    History Kevin Nunez has no past medical history on file.   He has a past surgical history that includes Cholecystectomy.   His family history is not on file.He reports that he has never smoked. He has never used smokeless tobacco. He reports that he does not drink alcohol or use drugs.    ROS Review of Systems  Constitutional: Negative for fever.  Respiratory: Negative for shortness of breath.   Cardiovascular: Negative for chest pain.  Musculoskeletal: Negative for arthralgias.  Skin: Negative for rash.    Objective:  BP 120/72   Pulse 95   Temp 98.7 F (37.1 C) (Oral)   Ht 5\' 6"  (1.676 m)   Wt 168 lb 6 oz (76.4 kg)   BMI 27.18 kg/m   BP Readings from Last 3 Encounters:  10/22/17 120/72  09/20/17 122/66  07/01/17 137/75    Wt Readings from Last 3 Encounters:  10/22/17 168 lb 6 oz (76.4 kg)  09/20/17 168 lb 2 oz (76.3 kg)  07/01/17 165 lb 8 oz (75.1 kg)     Physical Exam  Constitutional: He is oriented to person, place, and time. He appears well-developed and well-nourished.  HENT:  Head: Normocephalic and atraumatic.  Right Ear: External ear normal.  Left Ear: External ear normal.  Mouth/Throat: No oropharyngeal exudate or posterior  oropharyngeal erythema.  Eyes: Pupils are equal, round, and reactive to light.  Neck: Normal range of motion. Neck supple.  Cardiovascular: Normal rate and regular rhythm.  No murmur heard. Pulmonary/Chest: Breath sounds normal. No respiratory distress.  Musculoskeletal:  Patient attempted to raise himself from seated to standing with some difficulty he was able to do so but was very unsteady.  This could only be done while sitting in a straight back chair with solid armrests.  He is unable to repeat this maneuver with a cushioned chair with cushioned armrests.  Neurological: He is alert and oriented to person, place, and time.  Vitals reviewed.     Assessment & Plan:   [name] was seen today for face to face.  Diagnoses and all orders for this visit:  Gait instability -     DME Other see comment  Weakness of both arms -     DME Other see comment  Alzheimer's dementia without behavioral disturbance, unspecified timing of dementia onset -     rivastigmine (EXELON) 13.3 MG/24HR; Place 1 patch (13.3 mg total) onto the skin daily.       I have discontinued [name]'s rivastigmine. I am also having him start on rivastigmine. Additionally, I am having him maintain his ibuprofen, ([place] asiatica, (GOTU KOLA PO)), allopurinol,  and albuterol.  Allergies as of 10/22/2017      Reactions   Codeine Other (See Comments)   Altered mental status      Medication List        Accurate as of 10/22/17 11:59 PM. Always use your most recent med list.          albuterol (2.5 MG/3ML) 0.083% nebulizer solution Commonly known as:  PROVENTIL Take 3 mLs (2.5 mg total) by nebulization every 6 (six) hours as needed for wheezing or shortness of breath.   allopurinol 100 MG tablet Commonly known as:  ZYLOPRIM TAKE 1 TABLET BY MOUTH ONCE DAILY   GOTU KOLA PO Take by mouth.   ibuprofen 200 MG tablet Commonly known as:  ADVIL,MOTRIN Take 200 mg by mouth every 6 (six)  hours as needed.   rivastigmine 13.3 MG/24HR Commonly known as:  EXELON Place 1 patch (13.3 mg total) onto the skin daily.            Durable Medical Equipment  (From admission, onward)         Start     Ordered   10/22/17 0000  DME Other see comment    Comments:  Museum/gallery conservator Chair Dx weakness, imbalance, gait instability   10/22/17 1605           Follow-up: Return in about 3 months (around 01/21/2018).  Claretta Fraise, M.D.

## 2017-11-19 DIAGNOSIS — K469 Unspecified abdominal hernia without obstruction or gangrene: Secondary | ICD-10-CM | POA: Diagnosis not present

## 2017-11-19 DIAGNOSIS — K5730 Diverticulosis of large intestine without perforation or abscess without bleeding: Secondary | ICD-10-CM | POA: Diagnosis not present

## 2017-11-19 DIAGNOSIS — R1111 Vomiting without nausea: Secondary | ICD-10-CM | POA: Diagnosis not present

## 2017-11-19 DIAGNOSIS — K449 Diaphragmatic hernia without obstruction or gangrene: Secondary | ICD-10-CM | POA: Diagnosis not present

## 2017-11-19 DIAGNOSIS — R109 Unspecified abdominal pain: Secondary | ICD-10-CM | POA: Diagnosis not present

## 2017-11-19 DIAGNOSIS — N200 Calculus of kidney: Secondary | ICD-10-CM | POA: Diagnosis not present

## 2017-11-19 DIAGNOSIS — I10 Essential (primary) hypertension: Secondary | ICD-10-CM | POA: Diagnosis not present

## 2017-11-19 DIAGNOSIS — E860 Dehydration: Secondary | ICD-10-CM | POA: Diagnosis not present

## 2017-11-19 DIAGNOSIS — R0689 Other abnormalities of breathing: Secondary | ICD-10-CM | POA: Diagnosis not present

## 2017-11-19 DIAGNOSIS — R1114 Bilious vomiting: Secondary | ICD-10-CM | POA: Diagnosis not present

## 2017-11-22 ENCOUNTER — Other Ambulatory Visit: Payer: Self-pay | Admitting: *Deleted

## 2017-11-22 ENCOUNTER — Telehealth: Payer: Self-pay | Admitting: Family Medicine

## 2017-11-22 NOTE — Telephone Encounter (Signed)
Aware patient will need to be seen.  Declines appt at this time and states that she will not be able to get patient in.  Copy received of CT report

## 2017-11-22 NOTE — Patient Outreach (Signed)
Kevin Nunez Surgery Nunez) Nunez Management  11/22/2017  Kevin Nunez 08-30-1921 [id number]   Telephone Screen  Referral Date: 11/22/17 Referral Source: Kevin Nunez [facility] referral -Kevin Nunez Referral Reason: Kevin Nunez member phone number [phone] Members daughter states she has requested Kevin Nunez assistance and PT through member's pcp on multiple occasion with no success. Member has multiple Nunez with gait instability.  Member has a walker and cane.  Daughter also mention sometimes the member needs transportation assistance Gave daughter caregiver solutions number  Insurance: Kevin Nunez Nunez [employer] attempt # 1 successful to his daughter Patient's daughter, Kevin Nunez, is able to verify HIPAA Reviewed and addressed referral to Kevin Nunez with patient' s daughter  Kevin Nunez has Alzheimer, memory issues Nunez discussed call to Dr Kevin Nunez office to request assist with home Nunez referral, with a message for the home Nunez managing staff. Informed Kevin Nunez that a request was made for her or Kevin Nunez [place] RN Nunez to be contacted. Kevin Nunez states [facility] has provided services for Kevin Nunez previously and [facility] would be the choice of agency they would prefer Kevin Nunez states Kevin Nunez is doing okay at this moment as she is watching him and she is hoping he will be napping soon.  She reports he is been trying to pass a kidney stone for about 1 week    Social: Kevin Nunez lives a home with his daughter, Kevin Nunez and his grandson. He can feed himself but needs help with getting dressed and taking a shower  His daughter was assisting with transportation but she now is not able to because she has medical issues related to  blood clots and cancer  He is not aggressive per Kevin Nunez  Conditions: Presently trying to pass a kidney stone per his daughter, alzheimer, HTN, gait instability   Fall Kevin Nunez reports Kevin Nunez has had times in which he has fallen at least 2-3 times a day. His daughter estimates that he had  at least 20 Nunez in the last 3 months with one fall with injury to his back (a massive bruise) and once hitting his head. No medical treatment was received for either injury per his daughter  Medications: Kevin Nunez denies concerns with him taking medications as prescribed, affording medications, side effects of medications and questions about medications  DME  cane, walker, dentures, bars in bathroom, glasses  Appointments: 01/24/18 1555 he is scheduled for a 3 month f/u to Dr Kevin Nunez  Dr Kevin Nunez is the only MD being seen  He is not being seen by a neurologist  Kevin Directives: He has a Living will and his POA is Kevin Nunez  Consent: Kevin Nunez reviewed [facility] services with the patient's daughter. Kevin Nunez gave verbal consent for services for Kevin Nunez SW and [facility] RN Nunez.  Plan: [facility] RN Nunez will refer Kevin Nunez to Kevin [facility]unez [facility] Nunez SW for assistance with alternate transportation resources, Nunez giver respite, community resources, support services and personal Nunez services options [facility] RN Nunez will refer Kevin Nunez to Kevin Nunez for assistance with disease management, fall risks, HH/DME, Alzheimer specialist referral or education and assist with getting treatment for alzheimer   Kevin [facility] [place] RN Nunez called Dr Kevin Nunez office to reach the Home Nunez coordinator and left a voice message requesting the staff to return a call to the pt's number or [facility] RN Nunez. Nunez requested assistance with home Nunez for Kevin Nunez.    Kevin L. Lavina Hamman, RN, BSN, [place] Coordinator Office number [phone]  [place] number [phone]  Main [facility] number [phone] Fax number [phone]

## 2017-11-22 NOTE — Telephone Encounter (Signed)
Pharmacy [facility] [place]  Pt was seen yesterday at [facility] for kidney stones, pt is in a lot of pain and nausea according to daughter wants to know if something can be sent in to pharmacy for it.

## 2017-11-22 NOTE — Telephone Encounter (Signed)
Please offer him a same day appt.  He will need to be seen for this.  Please also make sure to request the records from [facility]

## 2017-11-25 ENCOUNTER — Telehealth: Payer: Self-pay

## 2017-11-25 ENCOUNTER — Other Ambulatory Visit: Payer: Self-pay | Admitting: *Deleted

## 2017-11-25 NOTE — Patient Outreach (Signed)
Referral received from [facility] telephonic case manager for in home assessment,  Telephone call to patient's daughter [name] and initial home visit scheduled for next week.  PLAN See pt for initial home visit next week  Jacqlyn Larsen [facility], [place] Coordinator [phone]

## 2017-11-25 NOTE — Telephone Encounter (Signed)
[facility] got a call from patients insurance stating that daughter has tried multiple times to get Dr Livia Snellen to get patient Home health assistance  And PT

## 2017-11-26 ENCOUNTER — Other Ambulatory Visit: Payer: Self-pay | Admitting: Family Medicine

## 2017-11-26 DIAGNOSIS — R2681 Unsteadiness on feet: Secondary | ICD-10-CM

## 2017-11-26 NOTE — Telephone Encounter (Signed)
lmtcb

## 2017-11-26 NOTE — Telephone Encounter (Signed)
Home health PT ordered

## 2017-11-27 ENCOUNTER — Other Ambulatory Visit: Payer: Self-pay

## 2017-11-27 NOTE — Patient Outreach (Signed)
[place] [facility]) Care Management  11/27/2017  Kevin Nunez 1921-12-12 [id number]   Initial outreach to patient's daughter, [name], regarding social work referral for assistance with in-home aide and transportation resources.   RNCM, Jackelyn Poling, contacted primary care provider on 11/22/17 regarding order for home health services.  This order was placed on 11/26/17.  BSW informed Ms. Platt that order was placed by Dr. Livia Snellen on this day.  BSW talked with her about in-home aide options upon discharge of home health services.  Ms. Layne Benton reported that patient does not qualify for Medicaid.  BSW informed her that private pay for in-home services is only option once home health services are complete.  BSW educated her about [employer] through [place], Disability and [employer].  BSW offered to send a list of other providers that she could contact regarding cost for in-home services but she declined.  BSW informed Ms. Platt that Mr. Messer has 24 one way trips available through [employer] transportation benefits.  BSW also informed her that [employer] is an option for transportation. BSW mailed [employer] brochure with in-home aide and transportation information. BSW mailed information on how to use [employer] transportation benefits.   BSW will follow up next week to ensure receipt of resources.  Ronn Melena, BSW Social Worker [phone]

## 2017-12-03 ENCOUNTER — Other Ambulatory Visit: Payer: Self-pay | Admitting: *Deleted

## 2017-12-03 ENCOUNTER — Encounter: Payer: Self-pay | Admitting: *Deleted

## 2017-12-03 NOTE — Telephone Encounter (Signed)
Home health has been order and scheduled

## 2017-12-03 NOTE — Patient Outreach (Signed)
[place]) Care Management   12/03/2017  Kevin Nunez 1921/10/22 [id number]  Kevin Nunez is an 84 y.o. male  Subjective: Initial home visit with pt, daughter [name] present, HIPAA verified, pt has worsening dementia with worse in the evening and sleeping a lot during the day and staying awake more at night with worry and agitation.  Pt has taken melatonin for awhile now per daughter but is not helping and would like "something to help with sleep"   Annie Paras reports pt wants her to stay and does not work well with other people staying with pt.  Annie Paras has papers to complete ane fax to [place] for in home assistance.  Pt recently passed kidney stone,  Pt has had "a lot of falls" per daughter.   Objective:   Vitals:   12/03/17 1400  BP: 118/62  Pulse: 72  Resp: 16  Weight: 167 lb (75.8 kg)  Height: 1.727 m (5\' 8" )  Unable to obtain 02 saturation  ROS  Physical Exam  Constitutional: He appears well-developed.  HENT:  Head: Normocephalic.  Neck: Normal range of motion. Neck supple.  Cardiovascular: Normal rate.  Respiratory: Effort normal and breath sounds normal.  GI: Soft. Bowel sounds are normal.  Musculoskeletal: Normal range of motion. He exhibits no edema.  Neurological: He is alert.  Oriented to name and DOB  Skin:  Hands and feet are cold to touch Pt has long, claw like toenails Dry skin all over with poor skin turgor  Psychiatric: His behavior is normal.  Pt answers some questions appropriately but also rambles at times, intermittent confusion.    Encounter Medications:   Outpatient Encounter Medications as of 12/03/2017  Medication Sig  . albuterol (PROVENTIL) (2.5 MG/3ML) 0.083% nebulizer solution Take 3 mLs (2.5 mg total) by nebulization every 6 (six) hours as needed for wheezing or shortness of breath.  . allopurinol (ZYLOPRIM) 100 MG tablet TAKE 1 TABLET BY MOUTH ONCE DAILY  . [place], [place] asiatica, (GOTU KOLA PO) Take by mouth.  [place] Kitchen  ibuprofen (ADVIL,MOTRIN) 200 MG tablet Take 200 mg by mouth every 6 (six) hours as needed.  . rivastigmine (EXELON) 13.3 MG/24HR Place 1 patch (13.3 mg total) onto the skin daily.   No facility-administered encounter medications on file as of 12/03/2017.     Functional Status:   In your present state of health, do you have any difficulty performing the following activities: 12/03/2017  Hearing? Y  Vision? N  Difficulty concentrating or making decisions? Y  Walking or climbing stairs? Y  Dressing or bathing? Y  Doing errands, shopping? Y  Preparing Food and eating ? Y  Using the Toilet? Y  In the past six months, have you accidently leaked urine? Y  Do you have problems with loss of bowel control? N  Managing your Medications? Y  Managing your Finances? Y  Housekeeping or managing your Housekeeping? Y  Some recent data might be hidden    Fall/Depression Screening:    Fall Risk  12/03/2017 11/22/2017 10/22/2017  Falls in the past year? 1 Yes Yes  Number falls in past yr: 1 2 or more 2 or more  Injury with Fall? 1 Yes No  Comment - Hit head, massive bruise on back no medical trip  -  Risk Factor Category  - - High Fall Risk  Risk for fall due to : History of fall(s);Impaired balance/gait;Medication side effect;Mental status change History of fall(s);Impaired balance/gait;Impaired mobility;Mental status change -  Follow up Falls evaluation  completed;Falls prevention discussed Falls prevention discussed -   PHQ 2/9 Scores 12/03/2017 11/22/2017 10/22/2017 09/20/2017 01/01/2017 12/25/2016 12/25/2016  PHQ - 2 Score 0 2 0 - 0 0 0  PHQ- 9 Score - 5 - - - - -  Exception Documentation - - Medical reason Medical reason - - -    Assessment:  RN CM observed medications and reviewed with patient's daughter, RN CM reviewed plan of care and goals with patient's daughter, Arita Miss is assisting with transportation, Annie Paras wants pt to continue staying at his home.  RN CM faxed initial home visit and barrier  letter to primary MD Dr. Livia Snellen, reported pt is not sleeping well and daughter interested in pt trying medication for sleep, reported daughter requests order for home health PT.  [facility] CM Care Plan Problem One     Most Recent Value  Care Plan Problem One  Worsening dementia  Role Documenting the Problem One  Care Management [place] for Problem One  Active  [facility] Long Term Goal   Caregiver will verbalize improved self care for patient needs related to dementia within 60 days  [facility] Long Term Goal Start Date  12/03/17  Interventions for Problem One Long Term Goal  RN CM gave copy 24 hour nurse line, reviewed resources to contact for change in health status, reviewed plan of care and goals related to keeping pt in the home environment and managing dementia  [facility] CM Short Term Goal #1   Daughter will call and schedule podiatry appointment for pt within 30 days  [facility] CM Short Term Goal #1 Start Date  12/03/17  Interventions for Short Term Goal #1  Pt has dry skin on feet and claw like toenails that need trimming,  daughter states she will make podiatry appointment  [facility] CM Short Term Goal #2   Daughter will complete forms from [place] (related to obtaining assistance in the home) within 30 days.  [facility] CM Short Term Goal #2 Start Date  12/03/17  Interventions for Short Term Goal #2  Daughter states she has the forms and is able to complete and will work towards this.  [facility] CM Short Term Goal #3  Pt will demonstrate better quality and quantity of sleep at night within 30 days.  [facility] CM Short Term Goal #3 Start Date  12/03/17  Interventions for Short Tern Goal #3  RN CM reported to primary MD pt is not sleeping much at night, ask if medication for sleep recommended  [facility] CM Short Term Goal #4  Pt will increase fluid intake within 30 days  [facility]N CM Short Term Goal #4 Start Date  12/03/17  Interventions for Short Term Goal #4  Pt has dry skin and poor skin turgor, daughter admits pt does not drink enough, RN  CM ask daughter to offer fluids throughout the day, water, juice, milk, etc,  reviewed urine should be light in color (diluted)    [facility]N CM Care Plan Problem Two     Most Recent Value  Care Plan Problem Two  HIgh risk for falls  Role Documenting the Problem Two  Care Management [place] for Problem Two  Active  [facility] CM Short Term Goal #1   Pt will have home health PT in place within 2 weeks  [facility] CM Short Term Goal #1 Start Date  12/03/17  Interventions for Short Term Goal #2   RN CM requested from primary care MD- order for home health PT.  RN CM  reviewed safety precautions with caregiver, importance of always assisting pt as he ambulates and to always use walker      Plan:  Follow up by telephone end of this week for PT referral Medication for sleep  Jacqlyn Larsen [facility], [place] Coordinator [phone]

## 2017-12-06 ENCOUNTER — Other Ambulatory Visit: Payer: Self-pay

## 2017-12-06 ENCOUNTER — Ambulatory Visit: Payer: Self-pay

## 2017-12-06 NOTE — Patient Outreach (Signed)
[facility]) Care Management  12/06/2017  Zacharius Funari 01/29/1922 [id number]   BSW attempted to outreach patient's daughter, [name], to ensure receipt of resources mailed on 11/27/17. BSW left voicemail message.  Will attempt to reach her again within four business days if no return call.  Ronn Melena, BSW Social Worker [phone]

## 2017-12-07 ENCOUNTER — Emergency Department (HOSPITAL_COMMUNITY): Payer: Medicare Other

## 2017-12-07 ENCOUNTER — Emergency Department (HOSPITAL_COMMUNITY)
Admission: EM | Admit: 2017-12-07 | Discharge: 2017-12-07 | Disposition: A | Discharge status: Home / Self Care | Payer: Medicare Other | Attending: Emergency Medicine | Admitting: Emergency Medicine

## 2017-12-07 ENCOUNTER — Other Ambulatory Visit: Payer: Self-pay

## 2017-12-07 ENCOUNTER — Encounter (HOSPITAL_COMMUNITY): Payer: Self-pay | Admitting: Emergency Medicine

## 2017-12-07 DIAGNOSIS — Z8546 Personal history of malignant neoplasm of prostate: Secondary | ICD-10-CM | POA: Diagnosis not present

## 2017-12-07 DIAGNOSIS — I10 Essential (primary) hypertension: Secondary | ICD-10-CM | POA: Diagnosis not present

## 2017-12-07 DIAGNOSIS — E860 Dehydration: Secondary | ICD-10-CM | POA: Diagnosis not present

## 2017-12-07 DIAGNOSIS — Z9049 Acquired absence of other specified parts of digestive tract: Secondary | ICD-10-CM | POA: Insufficient documentation

## 2017-12-07 DIAGNOSIS — Z79899 Other long term (current) drug therapy: Secondary | ICD-10-CM | POA: Insufficient documentation

## 2017-12-07 DIAGNOSIS — R112 Nausea with vomiting, unspecified: Secondary | ICD-10-CM | POA: Diagnosis not present

## 2017-12-07 DIAGNOSIS — F0280 Dementia in other diseases classified elsewhere without behavioral disturbance: Secondary | ICD-10-CM | POA: Insufficient documentation

## 2017-12-07 DIAGNOSIS — R110 Nausea: Secondary | ICD-10-CM | POA: Insufficient documentation

## 2017-12-07 DIAGNOSIS — R17 Unspecified jaundice: Secondary | ICD-10-CM

## 2017-12-07 DIAGNOSIS — G309 Alzheimer's disease, unspecified: Secondary | ICD-10-CM | POA: Diagnosis not present

## 2017-12-07 DIAGNOSIS — N200 Calculus of kidney: Secondary | ICD-10-CM | POA: Diagnosis not present

## 2017-12-07 DIAGNOSIS — R109 Unspecified abdominal pain: Secondary | ICD-10-CM | POA: Diagnosis not present

## 2017-12-07 HISTORY — DX: Dementia in other diseases classified elsewhere, unspecified severity, without behavioral disturbance, psychotic disturbance, mood disturbance, and anxiety: F02.80

## 2017-12-07 HISTORY — DX: Essential (primary) hypertension: I10

## 2017-12-07 HISTORY — DX: Malignant (primary) neoplasm, unspecified: C80.1

## 2017-12-07 HISTORY — DX: Alzheimer's disease, unspecified: G30.9

## 2017-12-07 HISTORY — DX: Disorder of kidney and ureter, unspecified: N28.9

## 2017-12-07 LAB — COMPREHENSIVE METABOLIC PANEL
ALT: 32 U/L (ref 0–44)
AST: 33 U/L (ref 15–41)
Albumin: 4.4 g/dL (ref 3.5–5.0)
Alkaline Phosphatase: 65 U/L (ref 38–126)
Anion gap: 7 (ref 5–15)
BUN: 29 mg/dL — AB (ref 8–23)
CHLORIDE: 103 mmol/L (ref 98–111)
CO2: 29 mmol/L (ref 22–32)
CREATININE: 1.58 mg/dL — AB (ref 0.61–1.24)
Calcium: 10 mg/dL (ref 8.9–10.3)
GFR calc Af Amer: 41 mL/min — ABNORMAL LOW (ref >60)
GFR calc non Af Amer: 36 mL/min — ABNORMAL LOW (ref >60)
Glucose, Bld: 130 mg/dL — ABNORMAL HIGH (ref 70–99)
Potassium: 4.3 mmol/L (ref 3.5–5.1)
SODIUM: 139 mmol/L (ref 135–145)
Total Bilirubin: 1.4 mg/dL — ABNORMAL HIGH (ref 0.3–1.2)
Total Protein: 7.5 g/dL (ref 6.5–8.1)

## 2017-12-07 LAB — URINALYSIS, ROUTINE W REFLEX MICROSCOPIC
Bacteria, UA: NONE SEEN
Bilirubin Urine: NEGATIVE
GLUCOSE, UA: NEGATIVE mg/dL
Ketones, ur: NEGATIVE mg/dL
Leukocytes, UA: NEGATIVE
NITRITE: NEGATIVE
PH: 7 (ref 5.0–8.0)
PROTEIN: NEGATIVE mg/dL
SPECIFIC GRAVITY, URINE: 1.011 (ref 1.005–1.030)

## 2017-12-07 LAB — CBC
HEMATOCRIT: 48.2 % (ref 39.0–52.0)
Hemoglobin: 15.1 g/dL (ref 13.0–17.0)
MCH: 29.1 pg (ref 26.0–34.0)
MCHC: 31.3 g/dL (ref 30.0–36.0)
MCV: 92.9 fL (ref 80.0–100.0)
Platelets: 132 10*3/uL — ABNORMAL LOW (ref 150–400)
RBC: 5.19 MIL/uL (ref 4.22–5.81)
RDW: 12.6 % (ref 11.5–15.5)
WBC: 5.8 10*3/uL (ref 4.0–10.5)
nRBC: 0 % (ref 0.0–0.2)

## 2017-12-07 LAB — LIPASE, BLOOD: Lipase: 36 U/L (ref 11–51)

## 2017-12-07 LAB — TROPONIN I: Troponin I: <0.03 ng/mL (ref <0.03)

## 2017-12-07 MED ORDER — IOPAMIDOL (ISOVUE-300) INJECTION 61%
75.0000 mL | Freq: Once | INTRAVENOUS | Status: AC | PRN
  Administered 2017-12-07: 75 mL via INTRAVENOUS

## 2017-12-07 MED ORDER — ONDANSETRON 4 MG PO TBDP: 4.0000 mg | ORAL_TABLET | Freq: Three times a day (TID) | ORAL | 0 refills | Status: DC | PRN

## 2017-12-07 MED ORDER — SODIUM CHLORIDE 0.9 % IV SOLN
Freq: Once | INTRAVENOUS | Status: AC
  Administered 2017-12-07: 1000 mL via INTRAVENOUS

## 2017-12-07 MED ORDER — SODIUM CHLORIDE 0.9 % IV SOLN
Freq: Once | INTRAVENOUS | Status: AC
  Administered 2017-12-07: 21:00:00 via INTRAVENOUS

## 2017-12-07 NOTE — ED Notes (Signed)
Pt taken to ct 

## 2017-12-07 NOTE — Discharge Instructions (Addendum)
Bilirubin slightly elevated.  CT scan showed no obvious life-threatening condition.  Encourage calories and fluids.  Prescription for nausea medicine.  Follow-up with your primary care doctor for further evaluation

## 2017-12-07 NOTE — ED Notes (Signed)
Pt returned from CT °

## 2017-12-07 NOTE — ED Notes (Signed)
Family reports 1 month hx of N Seen at [facility] 2 weeks ago dx'd with kidney stones  Per family member passed one but unsure if the other was passed  Was told Weds by home health was dehydrated and needed to go to ED Here today for evaluation

## 2017-12-07 NOTE — ED Triage Notes (Signed)
Pt c/o intermittent abdominal/back pain, n/v for a month. Pt dx with kidney stone a month ago at [facility]. Family reports he just has not gotten any better. Unsure if he had passed the stone. Hx of dementia.

## 2017-12-07 NOTE — ED Provider Notes (Signed)
[facility] EMERGENCY DEPARTMENT Provider Note   CSN: [id number] Arrival date & time: 12/07/17  1703     History   Chief Complaint Chief Complaint  Patient presents with  . Abdominal Pain    HPI Kevin Nunez is a 84 y.o. male.  Level 5 caveat for dementia.  Family reports nausea, decreased appetite, minimal vomiting for 1 month.  7 to 10 pound weight loss.  He lives at home with help.  He is more confused than usual.  No chest pain, dyspnea, fever, chills, dysuria.     Past Medical History:  Diagnosis Date  . Alzheimer's dementia ([place])   . Cancer [facility])    prostate  . Hypertension   . Renal disorder    kidney stones    Patient Active Problem List   Diagnosis Date Noted  . Gait instability 09/22/2017  . Essential hypertension 03/25/2014  . Alzheimer's disease ([place]) 03/25/2014    Past Surgical History:  Procedure Laterality Date  . CHOLECYSTECTOMY          Home Medications    Prior to Admission medications   Medication Sig Start Date End Date Taking? Authorizing Provider  albuterol (PROVENTIL) (2.5 MG/3ML) 0.083% nebulizer solution Take 3 mLs (2.5 mg total) by nebulization every 6 (six) hours as needed for wheezing or shortness of breath. 07/01/17  Yes Stacks, Cletus Gash, MD  allopurinol (ZYLOPRIM) 100 MG tablet TAKE 1 TABLET BY MOUTH ONCE DAILY Patient taking differently: Take 100 mg by mouth at bedtime.  07/01/17  Yes Stacks, Cletus Gash, MD  [place], Centella asiatica, (GOTU KOLA PO) Take 2 capsules by mouth at bedtime.    Yes [provider]  ibuprofen (ADVIL,MOTRIN) 200 MG tablet Take 200 mg by mouth every 6 (six) hours as needed for mild pain or moderate pain.    Yes [provider]  Melatonin 10 MG TABS Take 10 mg by mouth at bedtime.   Yes [provider]  rivastigmine (EXELON) 13.3 MG/24HR Place 1 patch (13.3 mg total) onto the skin daily. Patient taking differently: Place 13.3 mg onto the skin at bedtime.  10/22/17  Yes Claretta Fraise, MD  ondansetron (ZOFRAN ODT) 4 MG disintegrating tablet Take 1 tablet (4 mg total) by mouth every 8 (eight) hours as needed for nausea or vomiting. 12/07/17   Nat Christen, MD    Family History No family history on file.  Social History Social History   Tobacco Use  . Smoking status: Never Smoker  . Smokeless tobacco: Never Used  Substance Use Topics  . Alcohol use: No  . Drug use: No     Allergies   Codeine   Review of Systems Review of Systems  Unable to perform ROS: Dementia     Physical Exam Updated Vital Signs BP (!) 170/82   Pulse 84   Temp (!) 97.4 F (36.3 C) (Temporal)   Resp 20   Ht 5\' 8"  (1.727 m)   Wt 75.8 kg   SpO2 97%   BMI 25.39 kg/m   Physical Exam  Constitutional: He appears well-developed and well-nourished.  Able to answer questions, dehydrated  HENT:  Head: Normocephalic and atraumatic.  Eyes: Conjunctivae are normal.  Neck: Neck supple.  Cardiovascular: Normal rate and regular rhythm.  Pulmonary/Chest: Effort normal and breath sounds normal.  Abdominal: Soft. Bowel sounds are normal.  Musculoskeletal: Normal range of motion.  Neurological:  Moves all extremities  Skin: Skin is warm and dry.  Psychiatric: He has a normal mood and affect.  His behavior is normal.  Flat affect  Nursing note and vitals reviewed.    ED Treatments / Results  Labs (all labs ordered are listed, but only abnormal results are displayed) Labs Reviewed  COMPREHENSIVE METABOLIC PANEL - Abnormal; Notable for the following components:      Result Value   Glucose, Bld 130 (*)    BUN 29 (*)    Creatinine, Ser 1.58 (*)    Total Bilirubin 1.4 (*)    GFR calc non Af Amer 36 (*)    GFR calc Af Amer 41 (*)    All other components within normal limits  CBC - Abnormal; Notable for the following components:   Platelets 132 (*)    All other components within normal limits  URINALYSIS, ROUTINE W REFLEX MICROSCOPIC - Abnormal; Notable for the following  components:   Hgb urine dipstick SMALL (*)    All other components within normal limits  LIPASE, BLOOD  TROPONIN I    EKG EKG Interpretation  Date/Time:  Saturday December 07 2017 18:27:34 EST Ventricular Rate:  80 PR Interval:    QRS Duration: 96 QT Interval:  383 QTC Calculation: 442 R Axis:   63 Text Interpretation:  Sinus rhythm Borderline prolonged PR interval Confirmed by Nat Christen [phone]) on 12/07/2017 8:40:46 PM   Radiology Ct Abdomen Pelvis W Contrast  Result Date: 12/07/2017 CLINICAL DATA:  Nausea, vomiting and abdominal back pain. EXAM: CT ABDOMEN AND PELVIS WITH CONTRAST TECHNIQUE: Multidetector CT imaging of the abdomen and pelvis was performed using the standard protocol following bolus administration of intravenous contrast. CONTRAST:  75mL ISOVUE-300 IOPAMIDOL (ISOVUE-300) INJECTION 61% COMPARISON:  None. FINDINGS: Lower chest: The lung bases are clear. No pleural effusion. The heart is normal in size. No pericardial effusion. Moderate aortic calcifications are noted along with three-vessel coronary artery calcifications. Hepatobiliary: No focal hepatic lesions or intrahepatic biliary dilatation. The gallbladder surgically absent. No common bile duct dilatation. Pancreas: No mass, inflammation or ductal dilatation. Spleen: Normal size.  No focal lesions. Adrenals/Urinary Tract: The adrenal glands are normal. Bilateral renal calculi. There is a 13 mm calculus on the right and a 8 mm calculus on the left. There also bilateral renal cysts and moderate bilateral renal scarring changes. No obstructing ureteral calculi or bladder calculi. No worrisome renal or bladder lesions. Stomach/Bowel: The stomach, duodenum, small bowel and colon are grossly normal. No acute inflammatory changes, mass lesions or obstructive findings. Colonic diverticulosis without findings for acute diverticulitis. Vascular/Lymphatic: Advanced atherosclerotic calcifications involving the aorta and branch  vessels. No dissection. There is a 3 cm infrarenal abdominal aortic aneurysm. No mesenteric or retroperitoneal mass or adenopathy. Reproductive: Brachytherapy seeds noted in the prostate gland. Other: Bilateral inguinal hernias containing only fat are noted. Musculoskeletal: No significant bony findings. IMPRESSION: 1. No acute abdominal/pelvic findings, mass lesions or adenopathy. 2. Bilateral renal calculi but no obstructing ureteral calculi or bladder calculi. 3. Numerous renal cysts and renal scarring changes but no worrisome renal lesions. No worrisome bladder lesions. 4. Brachytherapy seeds noted in the prostate gland. 5. Colonic diverticulosis without findings for acute diverticulitis. Electronically Signed   By: Marijo Sanes M.D.   On: 12/07/2017 21:23   Dg Chest Port 1 View  Result Date: 12/07/2017 CLINICAL DATA:  Abdominal pain and back pain.  Nausea and vomiting. EXAM: PORTABLE CHEST 1 VIEW COMPARISON:  07/01/2017 FINDINGS: The heart size and mediastinal contours are within normal limits. Both lungs are clear. The visualized skeletal structures are unremarkable. IMPRESSION: No active disease.  Electronically Signed   By: Ulyses Jarred M.D.   On: 12/07/2017 18:23    Procedures Procedures (including critical care time)  Medications Ordered in ED Medications  0.9 %  sodium chloride infusion ( Intravenous Stopped 12/07/17 2033)  0.9 %  sodium chloride infusion ( Intravenous Stopped 12/07/17 2211)  iopamidol (ISOVUE-300) 61 % injection 75 mL (75 mLs Intravenous Contrast Given 12/07/17 2050)     Initial Impression / Assessment and Plan / ED Course  I have reviewed the triage vital signs and the nursing notes.  Pertinent labs & imaging results that were available during my care of the patient were reviewed by me and considered in my medical decision making (see chart for details).     Patient presents with nausea, decreased appetite, weight loss for 1 month.  Bilirubin 1.4.  Creatinine 1.58  (not new).  Patient feels much better after IV fluids.  Discussed test results with the family.  They will follow-up with his primary care doctor.  Discharge medications Zofran 4 mg ODT  Final Clinical Impressions(s) / ED Diagnoses   Final diagnoses:  Nausea  Elevated bilirubin    ED Discharge Orders         Ordered    ondansetron (ZOFRAN ODT) 4 MG disintegrating tablet  Every 8 hours PRN     12/07/17 2158           Nat Christen, MD 12/08/17 1252

## 2017-12-07 NOTE — ED Notes (Signed)
Pt is lethargic, with tenting skin  IV attempt x 2   Dr Lacinda Axon in to assess

## 2017-12-09 ENCOUNTER — Other Ambulatory Visit: Payer: Self-pay | Admitting: *Deleted

## 2017-12-09 NOTE — Patient Outreach (Signed)
Telephone call to patient's daughter [name] for follow up on medication for sleep and physical therapy, no answer to telephone, left voicemail requesting return phone call, mailed unsuccessful outreach letter to patient's home.  PLAN Outreach pt in 3-4 business days  Jacqlyn Larsen [facility], [place] [phone]

## 2017-12-10 ENCOUNTER — Other Ambulatory Visit: Payer: Self-pay

## 2017-12-10 ENCOUNTER — Ambulatory Visit: Payer: Self-pay

## 2017-12-10 NOTE — Patient Outreach (Signed)
[facility]) Care Management  12/10/2017  Kevin Nunez 1921/04/20 [id number]   Second follow up attempt to patient's daughter to ensure receipt of resources mailed on 11/27/17.  Phone went straight to voicemail and BSW left message.  Will attempt to reach again within four business days.  Unsuccessful outreach letter mailed on 12/09/17 by RNCM, Kevin Nunez.   Ronn Melena, BSW Social Worker [phone]

## 2017-12-12 ENCOUNTER — Other Ambulatory Visit: Payer: Self-pay | Admitting: *Deleted

## 2017-12-12 NOTE — Patient Outreach (Signed)
Telephone call to pt for care coordination to follow up on physical therapy referral and medication for sleep, no answer to telephone and no option to leave voicemail.  PLAN Outreach in 3-4 business days  Jacqlyn Larsen [facility], [place] [phone]

## 2017-12-13 ENCOUNTER — Ambulatory Visit: Payer: Self-pay

## 2017-12-13 ENCOUNTER — Other Ambulatory Visit: Payer: Self-pay

## 2017-12-13 NOTE — Patient Outreach (Signed)
[facility]) Care Management  12/13/2017  Kevin Nunez [birth year] [id number]   Third follow up attempt to patient's daughter, Kevin Nunez, to ensure receipt of resources mailed on 11/27/17.  BSW left voicemail message.  Closing case at this time.    Kevin Nunez, BSW Social Worker [phone]

## 2017-12-17 ENCOUNTER — Other Ambulatory Visit: Payer: Self-pay | Admitting: *Deleted

## 2017-12-17 NOTE — Patient Outreach (Signed)
Telephone call to patient's daughter [name] for care coordination, HIPAA verified, Onda sounds sleepy and states she thinks home health and doctor's office tried to call but she did not answer the phone, Annie Paras states she will check her messages and call RN CM back.  PLAN Await call back from daughter If not call back, outreach daughter later in the week  Jacqlyn Larsen [employer], [place] Coordinator [phone]

## 2017-12-19 ENCOUNTER — Ambulatory Visit: Payer: Medicare Other | Admitting: Family

## 2017-12-20 ENCOUNTER — Other Ambulatory Visit: Payer: Self-pay | Admitting: *Deleted

## 2017-12-20 ENCOUNTER — Encounter: Payer: Self-pay | Admitting: Family Medicine

## 2017-12-20 ENCOUNTER — Ambulatory Visit (INDEPENDENT_AMBULATORY_CARE_PROVIDER_SITE_OTHER): Payer: Medicare Other | Admitting: Family Medicine

## 2017-12-20 VITALS — BP 110/73 | HR 85 | Temp 98.0°F | Ht 68.0 in | Wt 163.0 lb

## 2017-12-20 DIAGNOSIS — Z23 Encounter for immunization: Secondary | ICD-10-CM

## 2017-12-20 DIAGNOSIS — L0231 Cutaneous abscess of buttock: Secondary | ICD-10-CM | POA: Diagnosis not present

## 2017-12-20 MED ORDER — SULFAMETHOXAZOLE-TRIMETHOPRIM 800-160 MG PO TABS: 1.0000 | ORAL_TABLET | Freq: Two times a day (BID) | ORAL | 0 refills | Status: DC

## 2017-12-20 NOTE — Progress Notes (Signed)
Subjective:    Patient ID: Kevin Nunez, male    DOB: [birth year], 84 y.o.   MRN: [id number]  Chief Complaint:  boil on buttock   HPI: Kevin Nunez is a 84 y.o. male presenting on 12/20/2017 for boil on buttock  Pt presents today with complaints of an abscess to his right buttock. Pt states this started about a week ago. States the area is hard and tender to touch. He reports a small amount of purulent drainage. He denies fever, chills, fatigue, or weakness. Pt does wear incontinent briefs.   Relevant past medical, surgical, family, and social history reviewed and updated as indicated.  Allergies and medications reviewed and updated.   Past Medical History:  Diagnosis Date  . Alzheimer's dementia ([place])   . Cancer [facility])    prostate  . Hypertension   . Renal disorder    kidney stones    Past Surgical History:  Procedure Laterality Date  . CHOLECYSTECTOMY      Social History   Socioeconomic History  . Marital status: Widowed    Spouse name: Not on file  . Number of children: Not on file  . Years of education: Not on file  . Highest education level: Not on file  Occupational History  . Not on file  Social Needs  . Financial resource strain: Not on file  . Food insecurity:    Worry: Not on file    Inability: Not on file  . Transportation needs:    Medical: Not on file    Non-medical: Not on file  Tobacco Use  . Smoking status: Never Smoker  . Smokeless tobacco: Never Used  Substance and Sexual Activity  . Alcohol use: No  . Drug use: No  . Sexual activity: Not Currently  Lifestyle  . Physical activity:    Days per week: Not on file    Minutes per session: Not on file  . Stress: Not on file  Relationships  . Social connections:    Talks on phone: Not on file    Gets together: Not on file    Attends religious service: Not on file    Active member of club or organization: Not on file    Attends meetings of clubs or organizations: Not on file   Relationship status: Not on file  . Intimate partner violence:    Fear of current or ex partner: Not on file    Emotionally abused: Not on file    Physically abused: Not on file    Forced sexual activity: Not on file  Other Topics Concern  . Not on file  Social History Narrative  . Not on file    Outpatient Encounter Medications as of 12/20/2017  Medication Sig  . albuterol (PROVENTIL) (2.5 MG/3ML) 0.083% nebulizer solution Take 3 mLs (2.5 mg total) by nebulization every 6 (six) hours as needed for wheezing or shortness of breath.  . allopurinol (ZYLOPRIM) 100 MG tablet TAKE 1 TABLET BY MOUTH ONCE DAILY (Patient taking differently: Take 100 mg by mouth at bedtime. )  . [place], Centella asiatica, (GOTU KOLA PO) Take 2 capsules by mouth at bedtime.   Marland Kitchen ibuprofen (ADVIL,MOTRIN) 200 MG tablet Take 200 mg by mouth every 6 (six) hours as needed for mild pain or moderate pain.   . Melatonin 10 MG TABS Take 10 mg by mouth at bedtime.  . rivastigmine (EXELON) 13.3 MG/24HR Place 1 patch (13.3 mg total) onto the skin daily. (Patient taking differently: Place  13.3 mg onto the skin at bedtime. )  . ondansetron (ZOFRAN ODT) 4 MG disintegrating tablet Take 1 tablet (4 mg total) by mouth every 8 (eight) hours as needed for nausea or vomiting. (Patient not taking: Reported on 12/20/2017)  . sulfamethoxazole-trimethoprim (BACTRIM DS) 800-160 MG tablet Take 1 tablet by mouth 2 (two) times daily for 7 days.   No facility-administered encounter medications on file as of 12/20/2017.     Allergies  Allergen Reactions  . Codeine Other (See Comments)    Altered mental status    Review of Systems  Constitutional: Negative for chills, fatigue and fever.  Respiratory: Negative for cough and shortness of breath.   Cardiovascular: Negative for chest pain and palpitations.  Gastrointestinal: Negative for constipation, diarrhea and rectal pain.  Skin: Positive for wound (right buttock, minimal drainage ).    Neurological: Negative for weakness.  All other systems reviewed and are negative.       Objective:    BP 110/73 (BP Location: Left Arm)   Pulse 85   Temp 98 F (36.7 C) (Oral)   Ht 5\' 8"  (1.727 m)   Wt 163 lb (73.9 kg)   BMI 24.78 kg/m    Wt Readings from Last 3 Encounters:  12/20/17 163 lb (73.9 kg)  12/07/17 167 lb (75.8 kg)  12/03/17 167 lb (75.8 kg)    Physical Exam  Constitutional: He appears well-developed. He is cooperative. No distress.  HENT:  Head: Normocephalic.  Cardiovascular: Normal rate, regular rhythm and normal heart sounds.  Pulmonary/Chest: Effort normal and breath sounds normal. No respiratory distress.  Neurological: He is alert.  Skin: Skin is warm and dry. Capillary refill takes less than 2 seconds.     Nursing note and vitals reviewed.   Results for orders placed or performed during the hospital encounter of 12/07/17  Lipase, blood  Result Value Ref Range   Lipase 36 11 - 51 U/L  Comprehensive metabolic panel  Result Value Ref Range   Sodium 139 135 - 145 mmol/L   Potassium 4.3 3.5 - 5.1 mmol/L   Chloride 103 98 - 111 mmol/L   CO2 29 22 - 32 mmol/L   Glucose, Bld 130 (H) 70 - 99 mg/dL   BUN 29 (H) 8 - 23 mg/dL   Creatinine, Ser 1.58 (H) 0.61 - 1.24 mg/dL   Calcium 10.0 8.9 - 10.3 mg/dL   Total Protein 7.5 6.5 - 8.1 g/dL   Albumin 4.4 3.5 - 5.0 g/dL   AST 33 15 - 41 U/L   ALT 32 0 - 44 U/L   Alkaline Phosphatase 65 38 - 126 U/L   Total Bilirubin 1.4 (H) 0.3 - 1.2 mg/dL   GFR calc non Af Amer 36 (L) >60 mL/min   GFR calc Af Amer 41 (L) >60 mL/min   Anion gap 7 5 - 15  CBC  Result Value Ref Range   WBC 5.8 4.0 - 10.5 K/uL   RBC 5.19 4.22 - 5.81 MIL/uL   Hemoglobin 15.1 13.0 - 17.0 g/dL   HCT 48.2 39.0 - 52.0 %   MCV 92.9 80.0 - 100.0 fL   MCH 29.1 26.0 - 34.0 pg   MCHC 31.3 30.0 - 36.0 g/dL   RDW 12.6 11.5 - 15.5 %   Platelets 132 (L) 150 - 400 K/uL   nRBC 0.0 0.0 - 0.2 %  Urinalysis, Routine w reflex microscopic  Result  Value Ref Range   Color, Urine YELLOW YELLOW   APPearance CLEAR  CLEAR   Specific Gravity, Urine 1.011 1.005 - 1.030   pH 7.0 5.0 - 8.0   Glucose, UA NEGATIVE NEGATIVE mg/dL   Hgb urine dipstick SMALL (A) NEGATIVE   Bilirubin Urine NEGATIVE NEGATIVE   Ketones, ur NEGATIVE NEGATIVE mg/dL   Protein, ur NEGATIVE NEGATIVE mg/dL   Nitrite NEGATIVE NEGATIVE   Leukocytes, UA NEGATIVE NEGATIVE   RBC / HPF 0-5 0 - 5 RBC/hpf   WBC, UA 0-5 0 - 5 WBC/hpf   Bacteria, UA NONE SEEN NONE SEEN   Squamous Epithelial / LPF 0-5 0 - 5   Mucus PRESENT   Troponin I Once  Result Value Ref Range   Troponin I <0.03 <0.03 ng/mL       Pertinent labs & imaging results that were available during my care of the patient were reviewed by me and considered in my medical decision making.  Assessment & Plan:  Treyon was seen today for boil on buttock.  Diagnoses and all orders for this visit:  Abscess of buttock, right Warm soaks several times per day. Allow area to drain. Do not pick or scratch at area. Culture pending. Medications as prescribed. Return for new or worsening symptoms.  -     Anaerobic and Aerobic Culture -     sulfamethoxazole-trimethoprim (BACTRIM DS) 800-160 MG tablet; Take 1 tablet by mouth 2 (two) times daily for 7 days.    Continue all other maintenance medications.  Follow up plan: Return in about 2 weeks (around 01/03/2018), or if symptoms worsen or fail to improve.  Educational handout given for skin abscess.   The above assessment and management plan was discussed with the patient. The patient verbalized understanding of and has agreed to the management plan. Patient is aware to call the clinic if symptoms persist or worsen. Patient is aware when to return to the clinic for a follow-up visit. Patient educated on when it is appropriate to go to the emergency department.   Monia Pouch, FNP-C [facility] [phone]

## 2017-12-20 NOTE — Patient Instructions (Signed)
Skin Abscess A skin abscess is an infected area on or under your skin that contains pus and other material. An abscess can happen almost anywhere on your body. Some abscesses break open (rupture) on their own. Most continue to get worse unless they are treated. The infection can spread deeper into the body and into your blood, which can make you feel sick. Treatment usually involves draining the abscess. Follow these instructions at home: Abscess Care  If you have an abscess that has not drained, place a warm, clean, wet washcloth over the abscess several times a day. Do this as told by your doctor.  Follow instructions from your doctor about how to take care of your abscess. Make sure you: ? Cover the abscess with a bandage (dressing). ? Change your bandage or gauze as told by your doctor. ? Wash your hands with soap and water before you change the bandage or gauze. If you cannot use soap and water, use hand sanitizer.  Check your abscess every day for signs that the infection is getting worse. Check for: ? More redness, swelling, or pain. ? More fluid or blood. ? Warmth. ? More pus or a bad smell. Medicines   Take over-the-counter and prescription medicines only as told by your doctor.  If you were prescribed an antibiotic medicine, take it as told by your doctor. Do not stop taking the antibiotic even if you start to feel better. General instructions  To avoid spreading the infection: ? Do not share personal care items, towels, or hot tubs with others. ? Avoid making skin-to-skin contact with other people.  Keep all follow-up visits as told by your doctor. This is important. Contact a doctor if:  You have more redness, swelling, or pain around your abscess.  You have more fluid or blood coming from your abscess.  Your abscess feels warm when you touch it.  You have more pus or a bad smell coming from your abscess.  You have a fever.  Your muscles ache.  You have  chills.  You feel sick. Get help right away if:  You have very bad (severe) pain.  You see red streaks on your skin spreading away from the abscess. This information is not intended to replace advice given to you by your health care provider. Make sure you discuss any questions you have with your health care provider. Document Released: 07/04/2007 Document Revised: 09/11/2015 Document Reviewed: 11/24/2014 Elsevier Interactive Patient Education  2018 Elsevier Inc.  

## 2017-12-20 NOTE — Patient Outreach (Signed)
Telephone call to patient's daughter Annie Paras and no answer to telephone and no option to leave voicemail.  PLAN Outreach pt in 3-4 business days  Jacqlyn Larsen [facility], [place] [phone]

## 2017-12-23 ENCOUNTER — Ambulatory Visit: Payer: Medicare Other | Admitting: Family Medicine

## 2017-12-24 LAB — ANAEROBIC AND AEROBIC CULTURE

## 2017-12-25 ENCOUNTER — Encounter: Payer: Self-pay | Admitting: Family Medicine

## 2017-12-25 ENCOUNTER — Telehealth: Payer: Self-pay | Admitting: *Deleted

## 2017-12-25 ENCOUNTER — Ambulatory Visit (INDEPENDENT_AMBULATORY_CARE_PROVIDER_SITE_OTHER): Payer: Medicare Other | Admitting: Family Medicine

## 2017-12-25 ENCOUNTER — Other Ambulatory Visit: Payer: Self-pay | Admitting: *Deleted

## 2017-12-25 VITALS — BP 101/67 | HR 89 | Temp 98.4°F | Ht 68.0 in | Wt 154.0 lb

## 2017-12-25 DIAGNOSIS — R112 Nausea with vomiting, unspecified: Secondary | ICD-10-CM

## 2017-12-25 MED ORDER — PROMETHAZINE HCL 25 MG/ML IJ SOLN
25.0000 mg | Freq: Once | INTRAMUSCULAR | Status: AC
  Administered 2017-12-25: 25 mg via INTRAMUSCULAR

## 2017-12-25 MED ORDER — ONDANSETRON 8 MG PO TBDP: 8.0000 mg | ORAL_TABLET | Freq: Three times a day (TID) | ORAL | 1 refills | Status: DC | PRN

## 2017-12-25 NOTE — Telephone Encounter (Signed)
TC w/ Jacqlyn Larsen w/ [facility] Daughter called [facility] regarding pt He has had N & V a lot lately. It has been all day today about every hour. He is also not sleeping, they have tried melatonin Please advise and call daughter Annie Paras at [phone], may leave a message  Home health orders were also faxed to [facility]

## 2017-12-25 NOTE — Telephone Encounter (Signed)
Patient has a follow up appointment scheduled. 

## 2017-12-25 NOTE — Telephone Encounter (Signed)
Pt. Needs to be seen for this. Thanks, WS 

## 2017-12-25 NOTE — Patient Outreach (Addendum)
Telephone call to pt for follow up on PT and medication for sleep, spoke with daughter [name] who reports she has not heard from home health agency, RN CM called [facility], spoke with Anderson Malta RN who states they do not have order for home health (this is agency daughter requested), Annie Paras states she has not heard about any medication to help pt sleep and states pt is having bouts of nausea and vomiting and zofran does not help, Onda states pt may go a few days and have no nausea and vomiting and then will have this for several days and vomit multiple times daily, she states pt is having nausea and vomiting today,  RN CM called [facility], spoke with Vinnie Level, ask about order for PT, per Juliann Pulse she does not see the order and will have order sent over to [facility] today. RN CM reported to Juliann Pulse that per daughter, pt is still not sleeping at night, stays up most of the night and she asks is there any medication that may help, reported pt has bouts of nausea and vomiting that can last several days and zofran does not provide any relief, Onda states pt is nauseous today and has vomited several times, Juliann Pulse will let primary MD know and they will contact [place].  THN CM Care Plan Problem One     Most Recent Value  Care Plan Problem One  Worsening dementia  Role Documenting the Problem One  Care Management Coordinator  Care Plan for Problem One  Active  THN Long Term Goal   Caregiver will verbalize improved self care for patient needs related to dementia within 60 days  THN Long Term Goal Start Date  12/03/17  Interventions for Problem One Long Term Goal  See narrative note, RN CM contacted primary care MD office about PT, reported insomnia and nause and vomiting, contacted home health.    THN CM Care Plan Problem Two     Most Recent Value  Care Plan Problem Two  HIgh risk for falls  Role Documenting the Problem Two  Care Management [place] for  Problem Two  Active  THN CM Short Term Goal #1   Pt will have home health PT in place within 2 weeks  THN CM Short Term Goal #1 Start Date  12/03/17      PLAN Follow up next week on PT Medication for sleep Nausea  Jacqlyn Larsen [facility], [place] Coordinator [phone]

## 2017-12-25 NOTE — Progress Notes (Signed)
Subjective:  Patient ID: Kevin Nunez, male    DOB: 02-08-1921  Age: 84 y.o. MRN: [id number]  CC: Nausea and Emesis   HPI Kevin Nunez presents for vomiting that has been ongoing for about 6 weeks.  It has been bad for about a month with an acceleration over the last 48 hours.  He has been vomiting brown-colored thick material also described as bilious by his daughter.  He is not eating well and has lost 9 pounds in the last 5 days states his daughter.  Of note is that he did hold down some Jell-O recently this morning and has been drinking Ensure periodically without vomiting.  The daughter and granddaughter are present and give portions of the history due to the patient's dementia.  His dose of Exelon was increased a couple of weeks before the start of the symptoms.  He is using the patch rather than an oral form however.  1 week ago he was prescribed sulfa and its use does correlate with the acceleration of his symptoms.  However symptoms were clearly present long before the use of that antibiotic.  He was seen in the emergency room 2 to 3 weeks ago and noted to have vomiting at that time.  That note was reviewed as was his CT report showing essentially normal pancreas and other organs.  He has a prescription for oral ondansetron 4 mg.  His daughter has given him to him on several occasions without noting any improvement, that is decreased in the severity or frequency of the symptoms.  Depression screen Kevin Nunez, Kevin 2/9 Nunez 12/20/2017 12/03/2017  Decreased Interest 0 0 0  Down, Depressed, Hopeless 0 0 0  PHQ - 2 Score 0 0 0  Altered sleeping 0 - -  Tired, decreased energy 0 - -  Change in appetite 0 - -  Feeling bad or failure about yourself  0 - -  Trouble concentrating 0 - -  Moving slowly or fidgety/restless 0 - -  Suicidal thoughts 0 - -  PHQ-9 Score 0 - -  Difficult doing work/chores Not difficult at all - -    History Kevin Nunez has a past medical history of Alzheimer's dementia  ([place]), Cancer ([place]), Hypertension, and Renal disorder.   He has a past surgical history that includes Cholecystectomy.   His family history is not on file.He reports that he has never smoked. He has never used smokeless tobacco. He reports that he does not drink alcohol or use drugs.    ROS Review of Systems  Unable to perform ROS: Dementia    Objective:  BP 101/67   Pulse 89   Temp 98.4 F (36.9 C) (Oral)   Ht '5\' 8"'  (1.727 m)   Wt 154 lb (69.9 kg)   BMI 23.42 kg/m   BP Readings from Last 3 Encounters:  12/25/17 101/67  12/20/17 110/73  12/07/17 (!) 170/82    Wt Readings from Last 3 Encounters:  12/25/17 154 lb (69.9 kg)  12/20/17 163 lb (73.9 kg)  12/07/17 167 lb (75.8 kg)     Physical Exam  Constitutional: He is oriented to person, place, and time. He appears well-developed and well-nourished. No distress.  HENT:  Head: Normocephalic and atraumatic.  Right Ear: External ear normal.  Left Ear: External ear normal.  Nose: Nose normal.  Mouth/Throat: Oropharynx is clear and moist.  Eyes: Pupils are equal, round, and reactive to light. Conjunctivae and EOM are normal.  Neck: Normal range of motion. Neck supple.  Cardiovascular:  Normal rate, regular rhythm and normal heart sounds.  No murmur heard. Pulmonary/Chest: Effort normal and breath sounds normal. No respiratory distress. He has no wheezes. He has no rales.  Abdominal: Soft. There is no tenderness.  Musculoskeletal: Normal range of motion.  Neurological: He is alert and oriented to person, place, and time. He has normal reflexes.  Skin: Skin is warm and dry.  Psychiatric: He has a normal mood and affect. His behavior is normal. Judgment and thought content normal.      Assessment & Plan:   Kevin Nunez was seen today for nausea and emesis.  Diagnoses and all orders for this visit:  Nausea and vomiting, intractability of vomiting not specified, unspecified vomiting type -     Lipase -     CMP14+EGFR -      CBC with Differential/Platelet -     promethazine (PHENERGAN) injection 25 mg -     Ambulatory referral to Gastroenterology  Other orders -     ondansetron (ZOFRAN-ODT) 8 MG disintegrating tablet; Take 1 tablet (8 mg total) by mouth every 8 (eight) hours as needed for nausea or vomiting.       I have discontinued Kevin Nunez rivastigmine and sulfamethoxazole-trimethoprim. I have also changed his ondansetron. Additionally, I am having him maintain his ibuprofen, ([place], [place] asiatica, (GOTU KOLA PO)), allopurinol, albuterol, and Melatonin. We administered promethazine.  Allergies as of Nunez      Reactions   Codeine Other (See Comments)   Altered mental status      Medication List        Accurate as of 12/25/17  5:30 PM. Always use your most recent med list.          albuterol (2.5 MG/3ML) 0.083% nebulizer solution Commonly known as:  PROVENTIL Take 3 mLs (2.5 mg total) by nebulization every 6 (six) hours as needed for wheezing or shortness of breath.   allopurinol 100 MG tablet Commonly known as:  ZYLOPRIM TAKE 1 TABLET BY MOUTH ONCE DAILY   GOTU KOLA PO Take 2 capsules by mouth at bedtime.   ibuprofen 200 MG tablet Commonly known as:  ADVIL,MOTRIN Take 200 mg by mouth every 6 (six) hours as needed for mild pain or moderate pain.   Melatonin 10 MG Tabs Take 10 mg by mouth at bedtime.   ondansetron 8 MG disintegrating tablet Commonly known as:  ZOFRAN-ODT Take 1 tablet (8 mg total) by mouth every 8 (eight) hours as needed for nausea or vomiting.        Follow-up: Return in about 2 weeks (around 01/08/2018).  Claretta Fraise, M.D.

## 2017-12-26 LAB — CMP14+EGFR
ALT: 8 IU/L (ref 0–44)
AST: 18 IU/L (ref 0–40)
Albumin/Globulin Ratio: 1.9 (ref 1.2–2.2)
Albumin: 4.6 g/dL (ref 3.2–4.6)
Alkaline Phosphatase: 78 IU/L (ref 39–117)
BUN/Creatinine Ratio: 10 (ref 10–24)
BUN: 28 mg/dL (ref 10–36)
Bilirubin Total: 0.9 mg/dL (ref 0.0–1.2)
CO2: 23 mmol/L (ref 20–29)
Calcium: 10.4 mg/dL — ABNORMAL HIGH (ref 8.6–10.2)
Chloride: 98 mmol/L (ref 96–106)
Creatinine, Ser: 2.71 mg/dL — ABNORMAL HIGH (ref 0.76–1.27)
GFR calc non Af Amer: 19 mL/min/{1.73_m2} — ABNORMAL LOW (ref >59)
GFR, EST AFRICAN AMERICAN: 22 mL/min/{1.73_m2} — AB (ref >59)
GLUCOSE: 109 mg/dL — AB (ref 65–99)
Globulin, Total: 2.4 g/dL (ref 1.5–4.5)
Potassium: 4.6 mmol/L (ref 3.5–5.2)
Sodium: 138 mmol/L (ref 134–144)
TOTAL PROTEIN: 7 g/dL (ref 6.0–8.5)

## 2017-12-26 LAB — CBC WITH DIFFERENTIAL/PLATELET
BASOS ABS: 0 10*3/uL (ref 0.0–0.2)
BASOS: 0 %
EOS (ABSOLUTE): 0.1 10*3/uL (ref 0.0–0.4)
Eos: 1 %
HEMOGLOBIN: 15.7 g/dL (ref 13.0–17.7)
Hematocrit: 46.9 % (ref 37.5–51.0)
IMMATURE GRANS (ABS): 0.1 10*3/uL (ref 0.0–0.1)
Immature Granulocytes: 1 %
LYMPHS: 16 %
Lymphocytes Absolute: 1.6 10*3/uL (ref 0.7–3.1)
MCH: 30.3 pg (ref 26.6–33.0)
MCHC: 33.5 g/dL (ref 31.5–35.7)
MCV: 91 fL (ref 79–97)
MONOCYTES: 9 %
Monocytes Absolute: 0.9 10*3/uL (ref 0.1–0.9)
NEUTROS ABS: 7.7 10*3/uL — AB (ref 1.4–7.0)
NEUTROS PCT: 73 %
PLATELETS: 218 10*3/uL (ref 150–450)
RBC: 5.18 x10E6/uL (ref 4.14–5.80)
RDW: 12.5 % (ref 12.3–15.4)
WBC: 10.4 10*3/uL (ref 3.4–10.8)

## 2017-12-26 LAB — LIPASE: LIPASE: 26 U/L (ref 13–78)

## 2017-12-27 ENCOUNTER — Other Ambulatory Visit: Payer: Self-pay | Admitting: *Deleted

## 2017-12-27 MED ORDER — ONDANSETRON 8 MG PO TBDP: 8.0000 mg | ORAL_TABLET | Freq: Three times a day (TID) | ORAL | 1 refills | Status: AC | PRN

## 2017-12-28 DIAGNOSIS — R5381 Other malaise: Secondary | ICD-10-CM | POA: Diagnosis not present

## 2017-12-28 DIAGNOSIS — W19XXXA Unspecified fall, initial encounter: Secondary | ICD-10-CM | POA: Diagnosis not present

## 2017-12-28 DIAGNOSIS — T1490XA Injury, unspecified, initial encounter: Secondary | ICD-10-CM | POA: Diagnosis not present

## 2017-12-30 ENCOUNTER — Telehealth: Payer: Self-pay | Admitting: Family Medicine

## 2017-12-30 DIAGNOSIS — R0689 Other abnormalities of breathing: Secondary | ICD-10-CM | POA: Diagnosis not present

## 2017-12-30 DIAGNOSIS — R0902 Hypoxemia: Secondary | ICD-10-CM | POA: Diagnosis not present

## 2017-12-30 DIAGNOSIS — W19XXXA Unspecified fall, initial encounter: Secondary | ICD-10-CM | POA: Diagnosis not present

## 2017-12-30 DIAGNOSIS — R000 Tachycardia, unspecified: Secondary | ICD-10-CM | POA: Diagnosis not present

## 2017-12-30 DIAGNOSIS — I959 Hypotension, unspecified: Secondary | ICD-10-CM | POA: Diagnosis not present

## 2017-12-30 NOTE — Telephone Encounter (Signed)
Left message advising order for hospital bed faxed over to [facility] and to call back with any questions or concerns.

## 2017-12-30 NOTE — Telephone Encounter (Signed)
Pt daughter is calling wanting to know if they can get a rx for hospital bed states that the pt has fallen 5 times in total, 3 times last 24 hours. Please call daughter if this is something we can do.   Typically uses [facility].

## 2017-12-31 ENCOUNTER — Other Ambulatory Visit: Payer: Self-pay | Admitting: *Deleted

## 2017-12-31 DIAGNOSIS — N179 Acute kidney failure, unspecified: Secondary | ICD-10-CM

## 2018-01-01 ENCOUNTER — Other Ambulatory Visit: Payer: Self-pay | Admitting: *Deleted

## 2018-01-01 NOTE — Patient Outreach (Signed)
Telephone call to pt daughter [name], no answer to telephone, left voicemail requesting return phone call.  RN CM mailed unsuccessful outreach letter to pt home.  PLAN Follow up in 3-4 business days  Jacqlyn Larsen [facility], [place] Coordinator [phone]

## 2018-01-06 ENCOUNTER — Other Ambulatory Visit: Payer: Self-pay | Admitting: *Deleted

## 2018-01-06 ENCOUNTER — Encounter: Payer: Self-pay | Admitting: Internal Medicine

## 2018-01-06 NOTE — Patient Outreach (Signed)
Telephone call to patient's daughter [name] for follow up, no answer to telephone, left voicemail requesting return phone call.  PLAN Follow up in 3-4 business days.  Jacqlyn Larsen [facility], [place] Coordinator [phone]

## 2018-01-09 ENCOUNTER — Other Ambulatory Visit: Payer: Self-pay | Admitting: *Deleted

## 2018-01-09 NOTE — Telephone Encounter (Signed)
Opened in error

## 2018-01-09 NOTE — Patient Outreach (Addendum)
Telephone call to patient's daughter [name], HIPAA verified, Annie Paras reports " they took daddy off that patch for dementia and he's had no more nausea and vomiting"  Onda reports pt is deconditioned and lays in bed most of the time, Annie Paras reports she requested hospital bed but has not heard back from this and pt still does not have PT.  RN CM called [facility], spoke with Lemmie Evens RN who reports they declined the referral due to lack of staff and now they are able to accept referrals again and MD would need to refax.  Gwynne Edinger is unable to check on hospital bed. Rn CM faxed letter to primary care office asking that they refax order to [facility] for PT and ask for update on hospital bed and if has been ordered which DME company, spoke with Vinnie Level at primary MD office, reported the above, Juliann Pulse states order for hospital bed faxed 01/08/18 and she will resend order for PT to [facility].    PLAN Outreach pt next week Follow up on PT and hospital bed  Jacqlyn Larsen [facility], [place] Coordinator [phone]

## 2018-01-14 DIAGNOSIS — Z9181 History of falling: Secondary | ICD-10-CM | POA: Diagnosis not present

## 2018-01-14 DIAGNOSIS — I10 Essential (primary) hypertension: Secondary | ICD-10-CM | POA: Diagnosis not present

## 2018-01-14 DIAGNOSIS — G309 Alzheimer's disease, unspecified: Secondary | ICD-10-CM | POA: Diagnosis not present

## 2018-01-16 ENCOUNTER — Other Ambulatory Visit: Payer: Self-pay | Admitting: Family Medicine

## 2018-01-16 ENCOUNTER — Other Ambulatory Visit: Payer: Self-pay | Admitting: *Deleted

## 2018-01-16 NOTE — Patient Outreach (Signed)
Telephone call to patient's daughter Annie Paras for follow up on physical therapy and hospital bed, HIPAA verified, Annie Paras reports physical therapy from [facility] is working with pt, Annie Paras states she has talked with physical therapist about the hospital bed (MD office states they have faxed to [facility]) and pt will need to see primary MD for face to face and DME company will need the MD notes, Annie Paras reports pt is to see primary MD end of December, pt has had no more nausea and vomiting, no falls. Onda reports pt has all medications and taking as prescribed.    PLAN Outreach pt for telephonic assessment  Jacqlyn Larsen [facility], [place] Coordinator [phone]

## 2018-01-17 DIAGNOSIS — I10 Essential (primary) hypertension: Secondary | ICD-10-CM | POA: Diagnosis not present

## 2018-01-17 DIAGNOSIS — G309 Alzheimer's disease, unspecified: Secondary | ICD-10-CM | POA: Diagnosis not present

## 2018-01-17 DIAGNOSIS — Z9181 History of falling: Secondary | ICD-10-CM | POA: Diagnosis not present

## 2018-01-21 DIAGNOSIS — Z9181 History of falling: Secondary | ICD-10-CM | POA: Diagnosis not present

## 2018-01-21 DIAGNOSIS — G309 Alzheimer's disease, unspecified: Secondary | ICD-10-CM | POA: Diagnosis not present

## 2018-01-21 DIAGNOSIS — I10 Essential (primary) hypertension: Secondary | ICD-10-CM | POA: Diagnosis not present

## 2018-01-24 ENCOUNTER — Encounter: Payer: Self-pay | Admitting: Family Medicine

## 2018-01-24 ENCOUNTER — Ambulatory Visit (INDEPENDENT_AMBULATORY_CARE_PROVIDER_SITE_OTHER): Payer: Medicare Other | Admitting: Family Medicine

## 2018-01-24 VITALS — BP 113/71 | HR 78 | Temp 97.3°F

## 2018-01-24 DIAGNOSIS — I10 Essential (primary) hypertension: Secondary | ICD-10-CM

## 2018-01-24 DIAGNOSIS — F0280 Dementia in other diseases classified elsewhere without behavioral disturbance: Secondary | ICD-10-CM

## 2018-01-24 DIAGNOSIS — R29898 Other symptoms and signs involving the musculoskeletal system: Secondary | ICD-10-CM | POA: Diagnosis not present

## 2018-01-24 DIAGNOSIS — R2681 Unsteadiness on feet: Secondary | ICD-10-CM | POA: Diagnosis not present

## 2018-01-24 DIAGNOSIS — G309 Alzheimer's disease, unspecified: Secondary | ICD-10-CM

## 2018-01-24 DIAGNOSIS — Z9181 History of falling: Secondary | ICD-10-CM | POA: Diagnosis not present

## 2018-01-24 MED ORDER — MEMANTINE HCL 28 X 5 MG & 21 X 10 MG PO TABS: ORAL_TABLET | ORAL | 12 refills | Status: DC

## 2018-01-24 MED ORDER — ALLOPURINOL 100 MG PO TABS: 100.0000 mg | ORAL_TABLET | Freq: Every day | ORAL | 1 refills | Status: DC

## 2018-01-24 NOTE — Progress Notes (Signed)
Subjective:  Patient ID: Kevin Nunez, male    DOB: 1921/06/19  Age: 84 y.o. MRN: [id number]  CC: Hypertension (3 month folow up) and Dementia   HPI Kevin Nunez presents for follow-up of hypertension. Patient has no history of headache chest pain or shortness of breath or recent cough. Patient also denies symptoms of TIA such as numbness weakness lateralizing. Patient checks  blood pressure at home and has not had any elevated readings recently. Patient denies side effects from his medication. States taking it regularly.   Patient's daughter gives most of the history, Ms. [name].  She tells me that his nausea completely cleared once he quit using the Exelon patch.  However, she notes that his cognition has declined.  His behavior has been somewhat difficult as well.  Depression screen [facility] 2/9 01/24/2018 12/25/2017 12/20/2017  Decreased Interest 0 0 0  Down, Depressed, Hopeless 0 0 0  PHQ - 2 Score 0 0 0  Altered sleeping - 0 -  Tired, decreased energy - 0 -  Change in appetite - 0 -  Feeling bad or failure about yourself  - 0 -  Trouble concentrating - 0 -  Moving slowly or fidgety/restless - 0 -  Suicidal thoughts - 0 -  PHQ-9 Score - 0 -  Difficult doing work/chores - Not difficult at all -    History Kevin Nunez has a past medical history of Alzheimer's dementia ([place]), Cancer ([place]), Hypertension, and Renal disorder.   He has a past surgical history that includes Cholecystectomy.   His family history is not on file.He reports that he has never smoked. He has never used smokeless tobacco. He reports that he does not drink alcohol or use drugs.    ROS Review of Systems  Constitutional: Negative.   HENT: Negative.   Eyes: Negative for visual disturbance.  Respiratory: Negative for cough and shortness of breath.   Cardiovascular: Negative for chest pain and leg swelling.  Gastrointestinal: Negative for abdominal pain, diarrhea, nausea and vomiting.  Genitourinary:  Negative for difficulty urinating.  Musculoskeletal: Positive for gait problem (Patient is off balance at times and uses a walker.  Occasionally his legs are so weak he has to use a wheelchair.). Negative for arthralgias and myalgias.  Skin: Negative for rash.  Neurological: Negative for headaches.  Psychiatric/Behavioral: Negative for sleep disturbance.    Objective:  BP 113/71   Pulse 78   Temp (!) 97.3 F (36.3 C) (Oral)   BP Readings from Last 3 Encounters:  01/24/18 113/71  12/25/17 101/67  12/20/17 110/73    Wt Readings from Last 3 Encounters:  12/25/17 154 lb (69.9 kg)  12/20/17 163 lb (73.9 kg)  12/07/17 167 lb (75.8 kg)     Physical Exam Constitutional:      General: He is not in acute distress.    Appearance: He is well-developed.  HENT:     Head: Normocephalic and atraumatic.     Right Ear: External ear normal.     Left Ear: External ear normal.     Nose: Nose normal.  Eyes:     Conjunctiva/sclera: Conjunctivae normal.     Pupils: Pupils are equal, round, and reactive to light.  Neck:     Musculoskeletal: Normal range of motion and neck supple.  Cardiovascular:     Rate and Rhythm: Normal rate and regular rhythm.     Heart sounds: Normal heart sounds. No murmur.  Pulmonary:     Effort: Pulmonary effort is normal. No  respiratory distress.     Breath sounds: Normal breath sounds. No wheezing or rales.  Abdominal:     Palpations: Abdomen is soft.     Tenderness: There is no abdominal tenderness.  Musculoskeletal:        General: No swelling.     Comments: Examined today in a wheelchair.  Skin:    General: Skin is warm and dry.     Findings: No erythema.  Neurological:     Mental Status: He is alert.     Cranial Nerves: Cranial nerves are intact.     Motor: Weakness (Diffuse, all extremities 3/5) present.     Coordination: Coordination abnormal.     Deep Tendon Reflexes: Reflexes are normal and symmetric.  Psychiatric:        Attention and  Perception: He is inattentive.        Mood and Affect: Mood normal.        Speech: Speech is slurred.        Behavior: Behavior normal.        Thought Content: Thought content normal.        Cognition and Memory: Cognition is impaired. Memory is impaired. He exhibits impaired recent memory.       Assessment & Plan:   Tajae was seen today for hypertension and dementia.  Diagnoses and all orders for this visit:  Essential hypertension -     CMP14+EGFR -     CBC with Differential/Platelet  Alzheimer's dementia without behavioral disturbance, unspecified timing of dementia onset (HCC)  Weakness of both arms  Gait instability  Other orders -     allopurinol (ZYLOPRIM) 100 MG tablet; Take 1 tablet (100 mg total) by mouth daily. -     memantine (NAMENDA TITRATION PAK) tablet pack; 5 mg/day for =1 week; 5 mg twice daily for =1 week; 15 mg/day given in 5 mg and 10 mg separated doses for =1 week; then 10 mg twice daily       I have changed Hawke Sheller's allopurinol. I am also having him start on memantine. Additionally, I am having him maintain his ibuprofen, ([place], [place] asiatica, (GOTU KOLA PO)), albuterol, Melatonin, and ondansetron.  Allergies as of 01/24/2018      Reactions   Codeine Other (See Comments)   Altered mental status      Medication List       Accurate as of January 24, 2018  5:20 PM. Always use your most recent med list.        albuterol (2.5 MG/3ML) 0.083% nebulizer solution Commonly known as:  PROVENTIL Take 3 mLs (2.5 mg total) by nebulization every 6 (six) hours as needed for wheezing or shortness of breath.   allopurinol 100 MG tablet Commonly known as:  ZYLOPRIM Take 1 tablet (100 mg total) by mouth daily.   GOTU KOLA PO Take 2 capsules by mouth at bedtime.   ibuprofen 200 MG tablet Commonly known as:  ADVIL,MOTRIN Take 200 mg by mouth every 6 (six) hours as needed for mild pain or moderate pain.   Melatonin 10 MG  Tabs Take 10 mg by mouth at bedtime.   memantine tablet pack Commonly known as:  NAMENDA TITRATION PAK 5 mg/day for =1 week; 5 mg twice daily for =1 week; 15 mg/day given in 5 mg and 10 mg separated doses for =1 week; then 10 mg twice daily   ondansetron 8 MG disintegrating tablet Commonly known as:  ZOFRAN-ODT Take 1 tablet (8 mg total) by  mouth every 8 (eight) hours as needed for nausea or vomiting.        Follow-up: Return in about 1 month (around 02/24/2018).  Claretta Fraise, M.D.

## 2018-01-25 LAB — CBC WITH DIFFERENTIAL/PLATELET
BASOS: 1 %
Basophils Absolute: 0 10*3/uL (ref 0.0–0.2)
EOS (ABSOLUTE): 0 10*3/uL (ref 0.0–0.4)
EOS: 1 %
HEMATOCRIT: 38 % (ref 37.5–51.0)
Hemoglobin: 13.3 g/dL (ref 13.0–17.7)
IMMATURE GRANS (ABS): 0 10*3/uL (ref 0.0–0.1)
Immature Granulocytes: 1 %
LYMPHS: 34 %
Lymphocytes Absolute: 1.3 10*3/uL (ref 0.7–3.1)
MCH: 30.4 pg (ref 26.6–33.0)
MCHC: 35 g/dL (ref 31.5–35.7)
MCV: 87 fL (ref 79–97)
MONOS ABS: 0.7 10*3/uL (ref 0.1–0.9)
Monocytes: 20 %
NEUTROS ABS: 1.7 10*3/uL (ref 1.4–7.0)
Neutrophils: 43 %
Platelets: 127 10*3/uL — ABNORMAL LOW (ref 150–450)
RBC: 4.37 x10E6/uL (ref 4.14–5.80)
RDW: 12.9 % (ref 12.3–15.4)
WBC: 3.8 10*3/uL (ref 3.4–10.8)

## 2018-01-25 LAB — CMP14+EGFR
A/G RATIO: 1.8 (ref 1.2–2.2)
ALT: 19 IU/L (ref 0–44)
AST: 21 IU/L (ref 0–40)
Albumin: 3.7 g/dL (ref 3.2–4.6)
Alkaline Phosphatase: 69 IU/L (ref 39–117)
BUN/Creatinine Ratio: 16 (ref 10–24)
BUN: 25 mg/dL (ref 10–36)
Bilirubin Total: 0.5 mg/dL (ref 0.0–1.2)
CALCIUM: 9.2 mg/dL (ref 8.6–10.2)
CO2: 24 mmol/L (ref 20–29)
CREATININE: 1.59 mg/dL — AB (ref 0.76–1.27)
Chloride: 104 mmol/L (ref 96–106)
GFR, EST AFRICAN AMERICAN: 42 mL/min/{1.73_m2} — AB (ref >59)
GFR, EST NON AFRICAN AMERICAN: 37 mL/min/{1.73_m2} — AB (ref >59)
GLOBULIN, TOTAL: 2.1 g/dL (ref 1.5–4.5)
Glucose: 82 mg/dL (ref 65–99)
POTASSIUM: 4.8 mmol/L (ref 3.5–5.2)
SODIUM: 139 mmol/L (ref 134–144)
TOTAL PROTEIN: 5.8 g/dL — AB (ref 6.0–8.5)

## 2018-01-27 ENCOUNTER — Telehealth: Payer: Self-pay

## 2018-01-27 ENCOUNTER — Ambulatory Visit (INDEPENDENT_AMBULATORY_CARE_PROVIDER_SITE_OTHER): Payer: Medicare Other

## 2018-01-27 DIAGNOSIS — G309 Alzheimer's disease, unspecified: Secondary | ICD-10-CM

## 2018-01-27 DIAGNOSIS — F0280 Dementia in other diseases classified elsewhere without behavioral disturbance: Secondary | ICD-10-CM | POA: Diagnosis not present

## 2018-01-27 DIAGNOSIS — R269 Unspecified abnormalities of gait and mobility: Secondary | ICD-10-CM | POA: Diagnosis not present

## 2018-01-27 DIAGNOSIS — L0231 Cutaneous abscess of buttock: Secondary | ICD-10-CM | POA: Diagnosis not present

## 2018-01-27 DIAGNOSIS — Z9181 History of falling: Secondary | ICD-10-CM

## 2018-01-27 DIAGNOSIS — I10 Essential (primary) hypertension: Secondary | ICD-10-CM | POA: Diagnosis not present

## 2018-01-27 MED ORDER — MEMANTINE HCL 5 MG PO TABS: ORAL_TABLET | ORAL | 0 refills | Status: DC

## 2018-01-27 NOTE — Addendum Note (Signed)
Addended by: Marylin Crosby on: 01/27/2018 05:19 PM   Modules accepted: Orders

## 2018-01-27 NOTE — Telephone Encounter (Signed)
Memantine Titra 5-10 mg pak on backorder  Can you change to 5 and 10 mg tablets?

## 2018-01-27 NOTE — Telephone Encounter (Signed)
Please contact pharmacy to authorize the same med, but without using the started pack.

## 2018-01-27 NOTE — Telephone Encounter (Signed)
Yes, or have him take 2 mg after the first week.

## 2018-01-27 NOTE — Telephone Encounter (Signed)
So do I need to send over the Memantine 10mg ?

## 2018-01-31 DIAGNOSIS — Z9181 History of falling: Secondary | ICD-10-CM | POA: Diagnosis not present

## 2018-01-31 DIAGNOSIS — G309 Alzheimer's disease, unspecified: Secondary | ICD-10-CM | POA: Diagnosis not present

## 2018-01-31 DIAGNOSIS — I10 Essential (primary) hypertension: Secondary | ICD-10-CM | POA: Diagnosis not present

## 2018-02-03 ENCOUNTER — Encounter: Payer: Self-pay | Admitting: Family Medicine

## 2018-02-03 ENCOUNTER — Ambulatory Visit (INDEPENDENT_AMBULATORY_CARE_PROVIDER_SITE_OTHER): Payer: Medicare Other | Admitting: Family Medicine

## 2018-02-03 ENCOUNTER — Other Ambulatory Visit: Payer: Self-pay

## 2018-02-03 ENCOUNTER — Emergency Department (HOSPITAL_COMMUNITY)
Admission: EM | Admit: 2018-02-03 | Discharge: 2018-02-03 | Disposition: A | Discharge status: Home / Self Care | Payer: Medicare Other | Attending: Emergency Medicine | Admitting: Emergency Medicine

## 2018-02-03 ENCOUNTER — Encounter (HOSPITAL_COMMUNITY): Payer: Self-pay | Admitting: *Deleted

## 2018-02-03 ENCOUNTER — Emergency Department (HOSPITAL_COMMUNITY): Payer: Medicare Other

## 2018-02-03 ENCOUNTER — Telehealth: Payer: Self-pay | Admitting: *Deleted

## 2018-02-03 VITALS — BP 131/54 | HR 104 | Temp 96.9°F | Resp 24

## 2018-02-03 DIAGNOSIS — Z79899 Other long term (current) drug therapy: Secondary | ICD-10-CM | POA: Diagnosis not present

## 2018-02-03 DIAGNOSIS — G309 Alzheimer's disease, unspecified: Secondary | ICD-10-CM | POA: Insufficient documentation

## 2018-02-03 DIAGNOSIS — Z9181 History of falling: Secondary | ICD-10-CM | POA: Diagnosis not present

## 2018-02-03 DIAGNOSIS — I10 Essential (primary) hypertension: Secondary | ICD-10-CM | POA: Insufficient documentation

## 2018-02-03 DIAGNOSIS — R0902 Hypoxemia: Secondary | ICD-10-CM | POA: Diagnosis not present

## 2018-02-03 DIAGNOSIS — R0989 Other specified symptoms and signs involving the circulatory and respiratory systems: Secondary | ICD-10-CM

## 2018-02-03 DIAGNOSIS — R062 Wheezing: Secondary | ICD-10-CM | POA: Diagnosis not present

## 2018-02-03 DIAGNOSIS — R05 Cough: Secondary | ICD-10-CM | POA: Diagnosis not present

## 2018-02-03 DIAGNOSIS — J40 Bronchitis, not specified as acute or chronic: Secondary | ICD-10-CM | POA: Diagnosis not present

## 2018-02-03 DIAGNOSIS — R0602 Shortness of breath: Secondary | ICD-10-CM | POA: Diagnosis not present

## 2018-02-03 LAB — CBC WITH DIFFERENTIAL/PLATELET
Abs Immature Granulocytes: 0.02 10*3/uL (ref 0.00–0.07)
BASOS ABS: 0 10*3/uL (ref 0.0–0.1)
Basophils Relative: 0 %
Eosinophils Absolute: 0.2 10*3/uL (ref 0.0–0.5)
Eosinophils Relative: 2 %
HCT: 44.1 % (ref 39.0–52.0)
Hemoglobin: 13.3 g/dL (ref 13.0–17.0)
Immature Granulocytes: 0 %
Lymphocytes Relative: 8 %
Lymphs Abs: 0.7 10*3/uL (ref 0.7–4.0)
MCH: 28.7 pg (ref 26.0–34.0)
MCHC: 30.2 g/dL (ref 30.0–36.0)
MCV: 95 fL (ref 80.0–100.0)
Monocytes Absolute: 0.8 10*3/uL (ref 0.1–1.0)
Monocytes Relative: 10 %
Neutro Abs: 6.5 10*3/uL (ref 1.7–7.7)
Neutrophils Relative %: 80 %
Platelets: 154 10*3/uL (ref 150–400)
RBC: 4.64 MIL/uL (ref 4.22–5.81)
RDW: 13.2 % (ref 11.5–15.5)
WBC: 8.2 10*3/uL (ref 4.0–10.5)
nRBC: 0 % (ref 0.0–0.2)

## 2018-02-03 LAB — COMPREHENSIVE METABOLIC PANEL
ALT: 18 U/L (ref 0–44)
AST: 21 U/L (ref 15–41)
Albumin: 3.7 g/dL (ref 3.5–5.0)
Alkaline Phosphatase: 53 U/L (ref 38–126)
Anion gap: 8 (ref 5–15)
BILIRUBIN TOTAL: 1.5 mg/dL — AB (ref 0.3–1.2)
BUN: 40 mg/dL — ABNORMAL HIGH (ref 8–23)
CO2: 27 mmol/L (ref 22–32)
Calcium: 9.3 mg/dL (ref 8.9–10.3)
Chloride: 104 mmol/L (ref 98–111)
Creatinine, Ser: 1.65 mg/dL — ABNORMAL HIGH (ref 0.61–1.24)
GFR calc Af Amer: 41 mL/min — ABNORMAL LOW (ref >60)
GFR calc non Af Amer: 35 mL/min — ABNORMAL LOW (ref >60)
Glucose, Bld: 101 mg/dL — ABNORMAL HIGH (ref 70–99)
Potassium: 3.8 mmol/L (ref 3.5–5.1)
Sodium: 139 mmol/L (ref 135–145)
TOTAL PROTEIN: 7 g/dL (ref 6.5–8.1)

## 2018-02-03 LAB — I-STAT CG4 LACTIC ACID, ED: Lactic Acid, Venous: 1.37 mmol/L (ref 0.5–1.9)

## 2018-02-03 MED ORDER — SODIUM CHLORIDE 0.9 % IV SOLN
500.0000 mg | INTRAVENOUS | Status: DC
  Administered 2018-02-03: 500 mg via INTRAVENOUS
  Filled 2018-02-03: qty 500

## 2018-02-03 MED ORDER — SODIUM CHLORIDE 0.9 % IV BOLUS (SEPSIS)
1000.0000 mL | Freq: Once | INTRAVENOUS | Status: AC
  Administered 2018-02-03: 1000 mL via INTRAVENOUS

## 2018-02-03 MED ORDER — SODIUM CHLORIDE 0.9 % IV SOLN
2.0000 g | INTRAVENOUS | Status: DC
  Administered 2018-02-03: 2 g via INTRAVENOUS
  Filled 2018-02-03: qty 20

## 2018-02-03 MED ORDER — ALBUTEROL SULFATE 0.63 MG/3ML IN NEBU: 0.6300 mg | INHALATION_SOLUTION | Freq: Once | RESPIRATORY_TRACT | Status: AC

## 2018-02-03 MED ORDER — AMOXICILLIN-POT CLAVULANATE 875-125 MG PO TABS: 1.0000 | ORAL_TABLET | Freq: Two times a day (BID) | ORAL | 0 refills | Status: DC

## 2018-02-03 NOTE — Discharge Instructions (Addendum)
Make sure that he drinks plenty of water, at least a liter a day.  Start the antibiotic prescription tomorrow.  Use the nebulizer every 4 hours as needed for cough or trouble breathing.  Return here, if needed, for problems.

## 2018-02-03 NOTE — ED Provider Notes (Signed)
[facility] EMERGENCY DEPARTMENT Provider Note   CSN: [id number] Arrival date & time: 02/03/18  2010     History   Chief Complaint Chief Complaint  Patient presents with  . Cough    HPI Kevin Nunez is a 85 y.o. male.  HPI   He presents to the ED for evaluation of cough, with low oxygen.  He was seen at his PCP office today.  Reportedly oxygen saturation was low, 88%, he was treated with a nebulizer, with improvement of oxygen saturation to 93%.  Note that initial vital signs here were taken at 2027.  He has been ill for 1 week with cough.  He has had decreased oral intake for the last few days.  He was in the ED about a month ago for ongoing vomiting and was felt to be intolerant of the Exelon patch he was on.  He was therefore changed to [place].  Since that time he began to eat better.  He is here with his granddaughter who lives near him.  He lives with his wife.  No known sick contacts.  He is taking his usual medications.  There are no other known modifying factors.  Past Medical History:  Diagnosis Date  . Alzheimer's dementia ([place])   . Cancer [facility])    prostate  . Hypertension   . Renal disorder    kidney stones    Patient Active Problem List   Diagnosis Date Noted  . Gait instability 09/22/2017  . Essential hypertension 03/25/2014  . Alzheimer's disease ([place]) 03/25/2014    Past Surgical History:  Procedure Laterality Date  . CHOLECYSTECTOMY          Home Medications    Prior to Admission medications   Medication Sig Start Date End Date Taking? Authorizing Provider  albuterol (PROVENTIL) (2.5 MG/3ML) 0.083% nebulizer solution Take 3 mLs (2.5 mg total) by nebulization every 6 (six) hours as needed for wheezing or shortness of breath. 07/01/17  Yes Claretta Fraise, MD  allopurinol (ZYLOPRIM) 100 MG tablet Take 1 tablet (100 mg total) by mouth daily. Patient taking differently: Take 100 mg by mouth at bedtime.  01/24/18  Yes Claretta Fraise, MD  cetirizine  (ZYRTEC) 10 MG tablet Take 10 mg by mouth at bedtime.   Yes [provider]  Chlorphen-Phenyleph-ASA (ALKA-SELTZER PLUS COLD) 2-7.8-325 MG TBEF Take 1 tablet by mouth daily as needed (for cold).   Yes [provider]  dextromethorphan (DELSYM) 30 MG/5ML liquid Take 60 mg by mouth as needed for cough.   Yes [provider]  ENSURE (ENSURE) Take 237 mLs by mouth daily.   Yes [provider]  [place], Centella asiatica, (GOTU KOLA PO) Take 2 capsules by mouth at bedtime.    Yes [provider]  Melatonin 10 MG TABS Take 10 mg by mouth at bedtime.   Yes [provider]  memantine (NAMENDA) 5 MG tablet 5 mg/day for =1 week; 5 mg twice daily for =1 week; 15 mg/day given in 5 mg and 10 mg separated doses for =1 week; then 10 mg twice daily Patient taking differently: Take 5 mg by mouth See admin instructions. 5 mg/day for =1 week; 5 mg twice daily for =1 week; 15 mg/day given in 5 mg and 10 mg separated doses for =1 week; then 10 mg twice daily 01/27/18  Yes Stacks, Cletus Gash, MD  Multiple Vitamin (MULTIVITAMIN WITH MINERALS) TABS tablet Take 1 tablet by mouth at bedtime.   Yes [provider]  amoxicillin-clavulanate (AUGMENTIN) 875-125 MG tablet Take 1 tablet by mouth 2 (two) times daily. One po bid x 7 days 02/03/18   Daleen Bo, MD  ibuprofen (ADVIL,MOTRIN) 200 MG tablet Take 200 mg by mouth every 6 (six) hours as needed for mild pain or moderate pain.     [provider]  memantine (NAMENDA TITRATION PAK) tablet pack 5 mg/day for =1 week; 5 mg twice daily for =1 week; 15 mg/day given in 5 mg and 10 mg separated doses for =1 week; then 10 mg twice daily Patient not taking: Reported on 02/03/2018 01/24/18   Claretta Fraise, MD  ondansetron (ZOFRAN-ODT) 8 MG disintegrating tablet Take 1 tablet (8 mg total) by mouth every 8 (eight) hours as needed for nausea or vomiting. Patient not taking: Reported on 02/03/2018 12/27/17   Claretta Fraise,  MD    Family History No family history on file.  Social History Social History   Tobacco Use  . Smoking status: Never Smoker  . Smokeless tobacco: Never Used  Substance Use Topics  . Alcohol use: No  . Drug use: No     Allergies   Codeine   Review of Systems Review of Systems  All other systems reviewed and are negative.    Physical Exam Updated Vital Signs BP 128/62   Pulse 86   Temp 98.7 F (37.1 C) (Rectal)   Resp (!) 21   Ht 5\' 8"  (1.727 m)   Wt 63.5 kg   SpO2 96%   BMI 21.29 kg/m   Physical Exam Vitals signs and nursing note reviewed.  Constitutional:      General: He is not in acute distress.    Appearance: Normal appearance. He is well-developed and normal weight. He is ill-appearing. He is not toxic-appearing or diaphoretic.     Comments: Elderly, frail  HENT:     Head: Normocephalic and atraumatic.     Right Ear: External ear normal.     Left Ear: External ear normal.     Nose: Nose normal.     Mouth/Throat:     Mouth: Mucous membranes are moist.     Pharynx: Oropharynx is clear.  Eyes:     Conjunctiva/sclera: Conjunctivae normal.     Pupils: Pupils are equal, round, and reactive to light.  Neck:     Musculoskeletal: Normal range of motion and neck supple.     Trachea: Phonation normal.  Cardiovascular:     Rate and Rhythm: Normal rate and regular rhythm.     Heart sounds: Normal heart sounds.  Pulmonary:     Effort: Pulmonary effort is normal. No respiratory distress.     Breath sounds: No stridor. No rhonchi.     Comments: Room air oxygen saturation 95%, at 9:07 PM.  Decreased air mild bilateral scattered rhonchi and wheezes.  There is no increased work of breathing. Chest:     Chest wall: No tenderness.  Abdominal:     General: There is no distension.     Palpations: Abdomen is soft. There is no mass.     Tenderness: There is no abdominal tenderness.     Hernia: No hernia is present.  Musculoskeletal: Normal range of motion.         General: No swelling or tenderness.  Skin:    General: Skin is warm and dry.     Coloration: Skin is not pale.  Neurological:     Mental Status: He is alert.     Cranial Nerves: No cranial nerve  deficit.     Sensory: No sensory deficit.     Motor: No abnormal muscle tone.     Coordination: Coordination normal.  Psychiatric:        Mood and Affect: Mood normal.        Behavior: Behavior normal.      ED Treatments / Results  Labs (all labs ordered are listed, but only abnormal results are displayed) Labs Reviewed  COMPREHENSIVE METABOLIC PANEL - Abnormal; Notable for the following components:      Result Value   Glucose, Bld 101 (*)    BUN 40 (*)    Creatinine, Ser 1.65 (*)    Total Bilirubin 1.5 (*)    GFR calc non Af Amer 35 (*)    GFR calc Af Amer 41 (*)    All other components within normal limits  CULTURE, BLOOD (ROUTINE X 2)  CULTURE, BLOOD (ROUTINE X 2)  CBC WITH DIFFERENTIAL/PLATELET  URINALYSIS, ROUTINE W REFLEX MICROSCOPIC  I-STAT CG4 LACTIC ACID, ED    EKG None  Radiology Dg Chest Port 1 View  Result Date: 02/03/2018 CLINICAL DATA:  Cough x1 week with low oxygen saturation. EXAM: PORTABLE CHEST 1 VIEW COMPARISON:  12/07/2017 FINDINGS: Normal heart size and mediastinal contours. Mild aortic atherosclerosis. No acute pulmonary consolidation, effusion or pneumothorax. No acute osseous abnormality. IMPRESSION: No active disease. Electronically Signed   By: Ashley Royalty M.D.   On: 02/03/2018 21:25    Procedures Procedures (including critical care time)  Medications Ordered in ED Medications  cefTRIAXone (ROCEPHIN) 2 g in sodium chloride 0.9 % 100 mL IVPB (0 g Intravenous Stopped 02/03/18 2219)  azithromycin (ZITHROMAX) 500 mg in sodium chloride 0.9 % 250 mL IVPB (500 mg Intravenous New Bag/Given 02/03/18 2149)  sodium chloride 0.9 % bolus 1,000 mL (1,000 mLs Intravenous New Bag/Given 02/03/18 2149)    And  sodium chloride 0.9 % bolus 1,000 mL (1,000 mLs  Intravenous New Bag/Given 02/03/18 2148)     Initial Impression / Assessment and Plan / ED Course  I have reviewed the triage vital signs and the nursing notes.  Pertinent labs & imaging results that were available during my care of the patient were reviewed by me and considered in my medical decision making (see chart for details).  Clinical Course as of Feb 03 2246  Mon Feb 03, 2018  2159 Normal  I-Stat CG4 Lactic Acid, ED  (not at  [facility]) [EW]  2159 CBC WITH DIFFERENTIAL [EW]  2200 No pneumonia, or CHF, images reviewed by me.  DG Chest Port 1 View [EW]  2231 Normal except glucose high, BUN high, creatinine high, T bilirubin high, GFR low  Comprehensive metabolic panel(!) [EW]    Clinical Course User Index [EW] Daleen Bo, MD     Patient Vitals for the past 24 hrs:  BP Temp Temp src Pulse Resp SpO2 Height Weight  02/03/18 2208 - 98.7 F (37.1 C) Rectal - - - - -  02/03/18 2200 128/62 - - 86 (!) 21 96 % - -  02/03/18 2100 (!) 116/92 - - (!) 54 (!) 24 (!) 84 % - -  02/03/18 2045 - - - 85 (!) 21 97 % - -  02/03/18 2030 115/60 - - 87 (!) 21 96 % - -  02/03/18 2027 120/66 97.7 F (36.5 C) Oral 94 (!) 24 96 % - -  02/03/18 2025 - - - - - - 5\' 8"  (1.727 m) 63.5 kg    10:35 PM Reevaluation with  update and discussion. After initial assessment and treatment, an updated evaluation reveals he remains comfortable with normal oxygen saturation.  Findings discussed with granddaughter who helps care for him.  She is comfortable taking him home, and treating him with nebulizer and will keep an eye on him tomorrow.  All questions answered. Daleen Bo   Medical Decision Making: Patient presenting with cough and hypoxia, hypoxia improved after nebulizer treatment at PCP office.  Chest x-ray is reassuring.  Mild insufficiency is stable from value taken last week.  No pneumonia on chest x-ray.  Patient has a nebulizer at home which he can use.  Doubt serious bacterial infection, metabolic  instability or impending vascular collapse.  CRITICAL CARE-yes Performed by: Daleen Bo  Nursing Notes Reviewed/ Care Coordinated Applicable Imaging Reviewed Interpretation of Laboratory Data incorporated into ED treatment  The patient appears reasonably screened and/or stabilized for discharge and I doubt any other medical condition or other [facility] requiring further screening, evaluation, or treatment in the ED at this time prior to discharge.  Plan: Home Medications-continue usual; Home Treatments-rest, fluids; return here if the recommended treatment, does not improve the symptoms; Recommended follow up-ECP checkup 3 days and as needed   Final Clinical Impressions(s) / ED Diagnoses   Final diagnoses:  Bronchitis    ED Discharge Orders         Ordered    amoxicillin-clavulanate (AUGMENTIN) 875-125 MG tablet  2 times daily     02/03/18 2248           Daleen Bo, MD 02/03/18 2248

## 2018-02-03 NOTE — Progress Notes (Signed)
Subjective:    Patient ID: Kevin Nunez, male    DOB: 02/22/1921, 85 y.o.   MRN: [id number]  Chief Complaint:  Cough (ongoing for 1.5 weeks, worse since Friday)   HPI: Kevin Nunez is a 85 y.o. male presenting on 02/03/2018 for Cough (ongoing for 1.5 weeks, worse since Friday)  Pt presents today with complaints of cough, congestion, weakness, fever, chills, and shortness of breath. Family states this started about 1.5 weeks ago and has continued to get worse. States he has become more congested since Friday and more lethargic.  Relevant past medical, surgical, family, and social history reviewed and updated as indicated.  Allergies and medications reviewed and updated.   Past Medical History:  Diagnosis Date  . Alzheimer's dementia ([place])   . Cancer [facility])    prostate  . Hypertension   . Renal disorder    kidney stones    Past Surgical History:  Procedure Laterality Date  . CHOLECYSTECTOMY      Social History   Socioeconomic History  . Marital status: Widowed    Spouse name: Not on file  . Number of children: Not on file  . Years of education: Not on file  . Highest education level: Not on file  Occupational History  . Not on file  Social Needs  . Financial resource strain: Not on file  . Food insecurity:    Worry: Not on file    Inability: Not on file  . Transportation needs:    Medical: Not on file    Non-medical: Not on file  Tobacco Use  . Smoking status: Never Smoker  . Smokeless tobacco: Never Used  Substance and Sexual Activity  . Alcohol use: No  . Drug use: No  . Sexual activity: Not Currently  Lifestyle  . Physical activity:    Days per week: Not on file    Minutes per session: Not on file  . Stress: Not on file  Relationships  . Social connections:    Talks on phone: Not on file    Gets together: Not on file    Attends religious service: Not on file    Active member of club or organization: Not on file    Attends meetings of clubs  or organizations: Not on file    Relationship status: Not on file  . Intimate partner violence:    Fear of current or ex partner: Not on file    Emotionally abused: Not on file    Physically abused: Not on file    Forced sexual activity: Not on file  Other Topics Concern  . Not on file  Social History Narrative  . Not on file    Outpatient Encounter Medications as of 02/03/2018  Medication Sig  . albuterol (PROVENTIL) (2.5 MG/3ML) 0.083% nebulizer solution Take 3 mLs (2.5 mg total) by nebulization every 6 (six) hours as needed for wheezing or shortness of breath.  . allopurinol (ZYLOPRIM) 100 MG tablet Take 1 tablet (100 mg total) by mouth daily.  . [place], Centella asiatica, (GOTU KOLA PO) Take 2 capsules by mouth at bedtime.   Marland Kitchen ibuprofen (ADVIL,MOTRIN) 200 MG tablet Take 200 mg by mouth every 6 (six) hours as needed for mild pain or moderate pain.   . Melatonin 10 MG TABS Take 10 mg by mouth at bedtime.  . memantine (NAMENDA TITRATION PAK) tablet pack 5 mg/day for =1 week; 5 mg twice daily for =1 week; 15 mg/day given in 5 mg and  10 mg separated doses for =1 week; then 10 mg twice daily  . memantine (NAMENDA) 5 MG tablet 5 mg/day for =1 week; 5 mg twice daily for =1 week; 15 mg/day given in 5 mg and 10 mg separated doses for =1 week; then 10 mg twice daily  . ondansetron (ZOFRAN-ODT) 8 MG disintegrating tablet Take 1 tablet (8 mg total) by mouth every 8 (eight) hours as needed for nausea or vomiting.   Facility-Administered Encounter Medications as of 02/03/2018  Medication  . albuterol (ACCUNEB) nebulizer solution 0.63 mg    Allergies  Allergen Reactions  . Codeine Other (See Comments)    Altered mental status    Review of Systems  Reason unable to perform ROS: ROS per family.  Constitutional: Positive for chills, fatigue and fever.  HENT: Positive for congestion.   Respiratory: Positive for cough and shortness of breath.   Cardiovascular: Negative for chest pain and  palpitations.  Neurological: Positive for weakness.  Psychiatric/Behavioral: Positive for confusion (baseline).  All other systems reviewed and are negative.       Objective:    BP (!) 131/54   Pulse (!) 104   Temp (!) 96.9 F (36.1 C)   Resp (!) 24   SpO2 (!) 88%    Wt Readings from Last 3 Encounters:  12/25/17 154 lb (69.9 kg)  12/20/17 163 lb (73.9 kg)  12/07/17 167 lb (75.8 kg)    Physical Exam Vitals signs and nursing note reviewed.  Constitutional:      Appearance: He is ill-appearing.  HENT:     Head: Normocephalic and atraumatic.     Nose: Congestion present.  Cardiovascular:     Rate and Rhythm: Normal rate and regular rhythm.     Heart sounds: Normal heart sounds.  Pulmonary:     Effort: Tachypnea and respiratory distress present.     Breath sounds: Wheezing and rhonchi present.  Skin:    General: Skin is warm and dry.     Capillary Refill: Capillary refill takes less than 2 seconds.     Coloration: Skin is cyanotic (fingers) and pale.  Neurological:     Mental Status: He is alert. Mental status is at baseline.  Psychiatric:        Behavior: Behavior is cooperative.     Results for orders placed or performed in visit on 01/24/18  CMP14+EGFR  Result Value Ref Range   Glucose 82 65 - 99 mg/dL   BUN 25 10 - 36 mg/dL   Creatinine, Ser 1.59 (H) 0.76 - 1.27 mg/dL   GFR calc non Af Amer 37 (L) >59 mL/min/1.73   GFR calc Af Amer 42 (L) >59 mL/min/1.73   BUN/Creatinine Ratio 16 10 - 24   Sodium 139 134 - 144 mmol/L   Potassium 4.8 3.5 - 5.2 mmol/L   Chloride 104 96 - 106 mmol/L   CO2 24 20 - 29 mmol/L   Calcium 9.2 8.6 - 10.2 mg/dL   Total Protein 5.8 (L) 6.0 - 8.5 g/dL   Albumin 3.7 3.2 - 4.6 g/dL   Globulin, Total 2.1 1.5 - 4.5 g/dL   Albumin/Globulin Ratio 1.8 1.2 - 2.2   Bilirubin Total 0.5 0.0 - 1.2 mg/dL   Alkaline Phosphatase 69 39 - 117 IU/L   AST 21 0 - 40 IU/L   ALT 19 0 - 44 IU/L  CBC with Differential/Platelet  Result Value Ref Range     WBC 3.8 3.4 - 10.8 x10E3/uL   RBC 4.37 4.14 -  5.80 x10E6/uL   Hemoglobin 13.3 13.0 - 17.7 g/dL   Hematocrit 38.0 37.5 - 51.0 %   MCV 87 79 - 97 fL   MCH 30.4 26.6 - 33.0 pg   MCHC 35.0 31.5 - 35.7 g/dL   RDW 12.9 12.3 - 15.4 %   Platelets 127 (L) 150 - 450 x10E3/uL   Neutrophils 43 Not Estab. %   Lymphs 34 Not Estab. %   Monocytes 20 Not Estab. %   Eos 1 Not Estab. %   Basos 1 Not Estab. %   Neutrophils Absolute 1.7 1.4 - 7.0 x10E3/uL   Lymphocytes Absolute 1.3 0.7 - 3.1 x10E3/uL   Monocytes Absolute 0.7 0.1 - 0.9 x10E3/uL   EOS (ABSOLUTE) 0.0 0.0 - 0.4 x10E3/uL   Basophils Absolute 0.0 0.0 - 0.2 x10E3/uL   Immature Granulocytes 1 Not Estab. %   Immature Grans (Abs) 0.0 0.0 - 0.1 x10E3/uL     Albuterol neb given in office, O2 sats increased to 93% post neb.   Pertinent labs & imaging results that were available during my care of the patient were reviewed by me and considered in my medical decision making.  Assessment & Plan:  Kevin Nunez was seen today for cough.  Diagnoses and all orders for this visit:  Hypoxia -     albuterol (ACCUNEB) nebulizer solution 0.63 mg  Chest congestion -     albuterol (ACCUNEB) nebulizer solution 0.63 mg  Shortness of breath  After much discussion with family and due to pts signs and symptoms and the inability to obtain a chest xray in office, pt will go to the ED for further treatment. [facility] ED [facility] for [facility] evaluation and treatment.    Continue all other maintenance medications.  Follow up plan: Return in about 1 week (around 02/10/2018), or if symptoms worsen or fail to improve.   The above assessment and management plan was discussed with the patient. The patient verbalized understanding of and has agreed to the management plan. Patient is aware to call the clinic if symptoms persist or worsen. Patient is aware when to return to the clinic for a follow-up visit. Patient educated on when it is appropriate to go to the  emergency department.   Monia Pouch, FNP-C [facility] [phone]

## 2018-02-03 NOTE — ED Triage Notes (Signed)
Pt sent to er from [facility] for further evalution of low pulse ox and cough, granddaughter reports that pt has been sick with a cough for a week, denies any fever,

## 2018-02-03 NOTE — Patient Instructions (Signed)
Pt to go to [facility] for further evaluation and treatment.

## 2018-02-03 NOTE — Telephone Encounter (Signed)
TC from [place] w/ [facility] Pt's lungs very congested with cough, she feels pt needs to be seen TC back to nurse, pt's vitals are good, she had advised them to go to urgent care on Friday when she had seen him, then again today since he developed the cough. I called the patient as well and advised the same thing since they are unable to get transportation until about 3 pm each day, we do not have any appts this evening, could see him tomorrow, but as noted they would not be able to get here till the evening but they could get to the urgent care this afternoon since the patient has worsened since the [facility] nurse saw him on Friday. Wife verbalized understanding

## 2018-02-06 DIAGNOSIS — G309 Alzheimer's disease, unspecified: Secondary | ICD-10-CM | POA: Diagnosis not present

## 2018-02-06 DIAGNOSIS — Z9181 History of falling: Secondary | ICD-10-CM | POA: Diagnosis not present

## 2018-02-06 DIAGNOSIS — I10 Essential (primary) hypertension: Secondary | ICD-10-CM | POA: Diagnosis not present

## 2018-02-07 DIAGNOSIS — I10 Essential (primary) hypertension: Secondary | ICD-10-CM | POA: Diagnosis not present

## 2018-02-07 DIAGNOSIS — G309 Alzheimer's disease, unspecified: Secondary | ICD-10-CM | POA: Diagnosis not present

## 2018-02-07 DIAGNOSIS — Z9181 History of falling: Secondary | ICD-10-CM | POA: Diagnosis not present

## 2018-02-08 LAB — CULTURE, BLOOD (ROUTINE X 2)
Culture: NO GROWTH
Culture: NO GROWTH
SPECIAL REQUESTS: ADEQUATE

## 2018-02-10 ENCOUNTER — Other Ambulatory Visit: Payer: Self-pay | Admitting: *Deleted

## 2018-02-10 ENCOUNTER — Encounter: Payer: Self-pay | Admitting: *Deleted

## 2018-02-10 DIAGNOSIS — I10 Essential (primary) hypertension: Secondary | ICD-10-CM | POA: Diagnosis not present

## 2018-02-10 DIAGNOSIS — G309 Alzheimer's disease, unspecified: Secondary | ICD-10-CM | POA: Diagnosis not present

## 2018-02-10 DIAGNOSIS — Z9181 History of falling: Secondary | ICD-10-CM | POA: Diagnosis not present

## 2018-02-10 NOTE — Patient Outreach (Signed)
Outreach to patient's daughter Kevin Nunez for telephonic assessment, per Kevin Nunez she and patient have been sick, pt saw primary MD on 02/03/18 and was sent to ED and diagnosed with bronchitis, Kevin Nunez reports pt feels much better although dementia is about the same, continues working with PT, has decided against getting hospital bed at present due to pt does not like too much change at once, Kevin Nunez states they are getting a thinner box spring and will try that, Kevin Nunez states home health CNA tried to call her several times and she did not answer due to being sick and has not left the house since November.  Kevin Nunez reports she did not make podiatry appointment or [facility] for [facility] and not working towards this as a goal right now, she is able to complete these tasks but main focus is pt completing PT and will work on other things later.  Kevin Nunez reports pt does have all medications and taking as prescribed.    THN CM Care Plan Problem One     Most Recent Value  Care Plan Problem One  Worsening dementia  Role Documenting the Problem One  Care Management Coordinator  Care Plan for Problem One  Active  THN Long Term Goal   Caregiver will verbalize improved self care for patient needs related to dementia within 60 days  THN Long Term Goal Start Date  02/10/18 Kevin Nunez re-established]  Interventions for Problem One Long Term Goal  RN CM reviewed plan of care with patient's daughter, due to new referral sent from [facility], RN CM extended long term goal  THN CM Short Term Goal #1   Daughter will call and schedule podiatry appointment for pt within 30 days  THN CM Short Term Goal #1 Start Date  12/03/17  Interventions for Short Term Goal #1  No longer working towards this goal per daughter  [facility] CM Short Term Goal #2   Daughter will complete forms from [place] (related to obtaining assistance in the home) within 30 days.  THN CM Short Term Goal #2 Start Date  12/03/17  Interventions for Short Term Goal #2  Patient's daughter has not  completed this and no longer working towards this goal at present  [place] Goal #3  Pt will demonstrate better quality and quantity of sleep at night within 30 days.  THN CM Short Term Goal #3 Start Date  12/03/17  THN CM Short Term Goal #3 Met Date  02/10/18  Interventions for Short Tern Goal #3  Pt sleeping better at times  [facility] CM Short Term Goal #4  Pt will increase fluid intake within 30 days  THN CM Short Term Goal #4 Start Date  02/10/18 [goal re-established]  Interventions for Short Term Goal #4  RN CM ask patient's daughter to offer pt fluids throughout the day and be mindful of color of urine (dehydration)    THN CM Care Plan Problem Two     Most Recent Value  Care Plan Problem Two  HIgh risk for falls  Role Documenting the Problem Two  Care Management Coordinator  Care Plan for Problem Two  Active  THN CM Short Term Goal #1   Pt will have home health PT in place within 2 weeks  THN CM Short Term Goal #1 Start Date  12/03/17  [facility] CM Short Term Goal #1 Met Date   02/10/18  Interventions for Short Term Goal #2   Pt now has physical therapy in place and working with them  weekly      PLAN Outreach pt for telephonic assessment next month Collaborate with home health as needed Assess falls, fluid intake  Jacqlyn Larsen [facility], [place] Coordinator [phone]

## 2018-02-11 DIAGNOSIS — G309 Alzheimer's disease, unspecified: Secondary | ICD-10-CM | POA: Diagnosis not present

## 2018-02-11 DIAGNOSIS — I10 Essential (primary) hypertension: Secondary | ICD-10-CM | POA: Diagnosis not present

## 2018-02-11 DIAGNOSIS — Z9181 History of falling: Secondary | ICD-10-CM | POA: Diagnosis not present

## 2018-02-12 DIAGNOSIS — I10 Essential (primary) hypertension: Secondary | ICD-10-CM | POA: Diagnosis not present

## 2018-02-12 DIAGNOSIS — G309 Alzheimer's disease, unspecified: Secondary | ICD-10-CM | POA: Diagnosis not present

## 2018-02-12 DIAGNOSIS — Z9181 History of falling: Secondary | ICD-10-CM | POA: Diagnosis not present

## 2018-02-13 DIAGNOSIS — Z9181 History of falling: Secondary | ICD-10-CM | POA: Diagnosis not present

## 2018-02-13 DIAGNOSIS — G309 Alzheimer's disease, unspecified: Secondary | ICD-10-CM | POA: Diagnosis not present

## 2018-02-13 DIAGNOSIS — I10 Essential (primary) hypertension: Secondary | ICD-10-CM | POA: Diagnosis not present

## 2018-02-14 DIAGNOSIS — I10 Essential (primary) hypertension: Secondary | ICD-10-CM | POA: Diagnosis not present

## 2018-02-14 DIAGNOSIS — Z9181 History of falling: Secondary | ICD-10-CM | POA: Diagnosis not present

## 2018-02-14 DIAGNOSIS — G309 Alzheimer's disease, unspecified: Secondary | ICD-10-CM | POA: Diagnosis not present

## 2018-02-17 DIAGNOSIS — G309 Alzheimer's disease, unspecified: Secondary | ICD-10-CM | POA: Diagnosis not present

## 2018-02-17 DIAGNOSIS — I10 Essential (primary) hypertension: Secondary | ICD-10-CM | POA: Diagnosis not present

## 2018-02-17 DIAGNOSIS — Z9181 History of falling: Secondary | ICD-10-CM | POA: Diagnosis not present

## 2018-02-18 DIAGNOSIS — I10 Essential (primary) hypertension: Secondary | ICD-10-CM | POA: Diagnosis not present

## 2018-02-18 DIAGNOSIS — Z9181 History of falling: Secondary | ICD-10-CM | POA: Diagnosis not present

## 2018-02-18 DIAGNOSIS — G309 Alzheimer's disease, unspecified: Secondary | ICD-10-CM | POA: Diagnosis not present

## 2018-02-19 DIAGNOSIS — Z9181 History of falling: Secondary | ICD-10-CM | POA: Diagnosis not present

## 2018-02-19 DIAGNOSIS — I10 Essential (primary) hypertension: Secondary | ICD-10-CM | POA: Diagnosis not present

## 2018-02-19 DIAGNOSIS — G309 Alzheimer's disease, unspecified: Secondary | ICD-10-CM | POA: Diagnosis not present

## 2018-02-20 DIAGNOSIS — G309 Alzheimer's disease, unspecified: Secondary | ICD-10-CM | POA: Diagnosis not present

## 2018-02-20 DIAGNOSIS — Z9181 History of falling: Secondary | ICD-10-CM | POA: Diagnosis not present

## 2018-02-20 DIAGNOSIS — I10 Essential (primary) hypertension: Secondary | ICD-10-CM | POA: Diagnosis not present

## 2018-02-25 DIAGNOSIS — G309 Alzheimer's disease, unspecified: Secondary | ICD-10-CM | POA: Diagnosis not present

## 2018-02-25 DIAGNOSIS — Z9181 History of falling: Secondary | ICD-10-CM | POA: Diagnosis not present

## 2018-02-25 DIAGNOSIS — I10 Essential (primary) hypertension: Secondary | ICD-10-CM | POA: Diagnosis not present

## 2018-02-27 DIAGNOSIS — Z9181 History of falling: Secondary | ICD-10-CM | POA: Diagnosis not present

## 2018-02-27 DIAGNOSIS — I10 Essential (primary) hypertension: Secondary | ICD-10-CM | POA: Diagnosis not present

## 2018-02-27 DIAGNOSIS — G309 Alzheimer's disease, unspecified: Secondary | ICD-10-CM | POA: Diagnosis not present

## 2018-03-04 DIAGNOSIS — G309 Alzheimer's disease, unspecified: Secondary | ICD-10-CM | POA: Diagnosis not present

## 2018-03-04 DIAGNOSIS — I10 Essential (primary) hypertension: Secondary | ICD-10-CM | POA: Diagnosis not present

## 2018-03-04 DIAGNOSIS — Z9181 History of falling: Secondary | ICD-10-CM | POA: Diagnosis not present

## 2018-03-06 DIAGNOSIS — G309 Alzheimer's disease, unspecified: Secondary | ICD-10-CM | POA: Diagnosis not present

## 2018-03-06 DIAGNOSIS — I10 Essential (primary) hypertension: Secondary | ICD-10-CM | POA: Diagnosis not present

## 2018-03-06 DIAGNOSIS — Z9181 History of falling: Secondary | ICD-10-CM | POA: Diagnosis not present

## 2018-03-07 ENCOUNTER — Ambulatory Visit: Payer: Medicare Other | Admitting: Family Medicine

## 2018-03-10 ENCOUNTER — Other Ambulatory Visit: Payer: Self-pay | Admitting: *Deleted

## 2018-03-10 NOTE — Patient Outreach (Signed)
Outreach call to patient's daughter for telephone assessment, no answer to telephone and no option to leave voicemail.  PLAN Outreach pt in 3-4 business days  Jacqlyn Larsen [facility], [place] [phone]

## 2018-03-12 ENCOUNTER — Encounter: Payer: Self-pay | Admitting: Family Medicine

## 2018-03-12 ENCOUNTER — Ambulatory Visit (INDEPENDENT_AMBULATORY_CARE_PROVIDER_SITE_OTHER): Payer: Medicare Other | Admitting: Family Medicine

## 2018-03-12 VITALS — BP 127/69 | HR 78 | Temp 97.5°F | Ht 68.0 in | Wt 158.1 lb

## 2018-03-12 DIAGNOSIS — G309 Alzheimer's disease, unspecified: Secondary | ICD-10-CM | POA: Diagnosis not present

## 2018-03-12 DIAGNOSIS — F0280 Dementia in other diseases classified elsewhere without behavioral disturbance: Secondary | ICD-10-CM

## 2018-03-12 DIAGNOSIS — G4701 Insomnia due to medical condition: Secondary | ICD-10-CM | POA: Diagnosis not present

## 2018-03-12 MED ORDER — MEMANTINE HCL 10 MG PO TABS: 10.0000 mg | ORAL_TABLET | Freq: Two times a day (BID) | ORAL | 2 refills | Status: AC

## 2018-03-12 MED ORDER — TRAZODONE HCL 150 MG PO TABS: ORAL_TABLET | ORAL | 5 refills | Status: DC

## 2018-03-12 NOTE — Progress Notes (Signed)
Subjective:  Patient ID: Kevin Nunez, male    DOB: 22-Jun-1921  Age: 85 y.o. MRN: [id number]  CC: Medical Management of Chronic Issues (pt here today for follow up after starting Namenda)   HPI Kevin Nunez presents for recheck of his dementia.  The nausea has returned but unfortunately he is improving with regard to cognition when he uses the [place].  However he is only been on the full dose for a couple of weeks therefore the daughter would like to give it a longer trial.  They are only other concern today is that he is awake all night.  He will stay awake until 7 AM and then does through the day at times off and on.  He will come into his daughter's room and wake her up repeatedly during the night wondering where he is and where his family is and if they are all right.  History is given by his daughter Ms. Kevin Nunez.  Depression screen [facility] 2/9 02/03/2018 01/24/2018 12/25/2017  Decreased Interest 0 0 0  Down, Depressed, Hopeless 0 0 0  PHQ - 2 Score 0 0 0  Altered sleeping - - 0  Tired, decreased energy - - 0  Change in appetite - - 0  Feeling bad or failure about yourself  - - 0  Trouble concentrating - - 0  Moving slowly or fidgety/restless - - 0  Suicidal thoughts - - 0  PHQ-9 Score - - 0  Difficult doing work/chores - - Not difficult at all    History Kevin Nunez has a past medical history of Alzheimer's dementia ([place]), Cancer ([place]), Hypertension, and Renal disorder.   He has a past surgical history that includes Cholecystectomy.   His family history is not on file.He reports that he has never smoked. He has never used smokeless tobacco. He reports that he does not drink alcohol or use drugs.    ROS Review of Systems  Unable to perform ROS: Dementia    Objective:  BP 127/69   Pulse 78   Temp (!) 97.5 F (36.4 C) (Oral)   Ht 5\' 8"  (1.727 m)   Wt 158 lb 2 oz (71.7 kg)   BMI 24.04 kg/m   BP Readings from Last 3 Encounters:  03/12/18 127/69  02/03/18 129/61    02/03/18 (!) 131/54    Wt Readings from Last 3 Encounters:  03/12/18 158 lb 2 oz (71.7 kg)  02/03/18 140 lb (63.5 kg)  12/25/17 154 lb (69.9 kg)     Physical Exam Vitals signs reviewed.  Constitutional:      Appearance: He is well-developed.  HENT:     Head: Normocephalic and atraumatic.     Right Ear: External ear normal.     Left Ear: External ear normal.     Mouth/Throat:     Pharynx: No oropharyngeal exudate or posterior oropharyngeal erythema.  Eyes:     Pupils: Pupils are equal, round, and reactive to light.  Neck:     Musculoskeletal: Normal range of motion and neck supple.  Cardiovascular:     Rate and Rhythm: Normal rate and regular rhythm.     Heart sounds: No murmur.  Pulmonary:     Effort: No respiratory distress.     Breath sounds: Normal breath sounds.  Neurological:     General: No focal deficit present.     Mental Status: He is alert. He is disoriented.  Psychiatric:        Mood and Affect: Mood normal.  Assessment & Plan:   Kevin Nunez was seen today for medical management of chronic issues.  Diagnoses and all orders for this visit:  Alzheimer's dementia without behavioral disturbance, unspecified timing of dementia onset ([place])  Insomnia due to medical condition  Other orders -     traZODone (DESYREL) 150 MG tablet; Use from 1/3 to 1 tablet nightly as needed for sleep. -     memantine (NAMENDA) 10 MG tablet; Take 1 tablet (10 mg total) by mouth 2 (two) times daily.       I have discontinued Percell Miller Klima's memantine and amoxicillin-clavulanate. I have also changed his memantine. Additionally, I am having him start on traZODone. Lastly, I am having him maintain his ibuprofen, ([place] asiatica, (GOTU KOLA PO)), albuterol, Melatonin, ondansetron, allopurinol, cetirizine, multivitamin with minerals, ENSURE, dextromethorphan, and Chlorphen-Phenyleph-ASA. We will continue to administer albuterol.  Allergies as of 03/12/2018       Reactions   Codeine Other (See Comments)   Altered mental status      Medication List       Accurate as of March 12, 2018  6:30 PM. Always use your most recent med list.        albuterol (2.5 MG/3ML) 0.083% nebulizer solution Commonly known as:  PROVENTIL Take 3 mLs (2.5 mg total) by nebulization every 6 (six) hours as needed for wheezing or shortness of breath.   ALKA-SELTZER PLUS COLD 2-7.8-325 MG Tbef Generic drug:  Chlorphen-Phenyleph-ASA Take 1 tablet by mouth daily as needed (for cold).   allopurinol 100 MG tablet Commonly known as:  ZYLOPRIM Take 1 tablet (100 mg total) by mouth daily.   cetirizine 10 MG tablet Commonly known as:  ZYRTEC Take 10 mg by mouth at bedtime.   DELSYM 30 MG/5ML liquid Generic drug:  dextromethorphan Take 60 mg by mouth as needed for cough.   ENSURE Take 237 mLs by mouth daily.   GOTU KOLA PO Take 2 capsules by mouth at bedtime.   ibuprofen 200 MG tablet Commonly known as:  ADVIL,MOTRIN Take 200 mg by mouth every 6 (six) hours as needed for mild pain or moderate pain.   Melatonin 10 MG Tabs Take 10 mg by mouth at bedtime.   memantine 10 MG tablet Commonly known as:  NAMENDA Take 1 tablet (10 mg total) by mouth 2 (two) times daily.   multivitamin with minerals Tabs tablet Take 1 tablet by mouth at bedtime.   ondansetron 8 MG disintegrating tablet Commonly known as:  ZOFRAN-ODT Take 1 tablet (8 mg total) by mouth every 8 (eight) hours as needed for nausea or vomiting.   traZODone 150 MG tablet Commonly known as:  DESYREL Use from 1/3 to 1 tablet nightly as needed for sleep.        Follow-up: Return in about 6 weeks (around 04/23/2018).  Claretta Fraise, M.D.

## 2018-03-13 ENCOUNTER — Other Ambulatory Visit: Payer: Self-pay | Admitting: *Deleted

## 2018-03-13 NOTE — Patient Outreach (Addendum)
Outreach call to patient's daughter (2nd attempt) for telephone assessment, no answer to telephone and no option to leave voicemail.  RN CM mailed unsuccessful outreach letter to patient's home.  PLAN Outreach patient's daughter in 3-4 business days  Jacqlyn Larsen [facility], [place] Coordinator [phone]

## 2018-03-18 ENCOUNTER — Other Ambulatory Visit: Payer: Self-pay | Admitting: *Deleted

## 2018-03-18 NOTE — Patient Outreach (Signed)
Outreach call (3rd attempt) for telephone assessment, no answer to telephone and no option to leave voicemail.  PLAN Close case in 3-4 business if no response from pt, daughter  Jacqlyn Larsen [facility], [place] Coordinator [phone]

## 2018-03-21 ENCOUNTER — Other Ambulatory Visit: Payer: Self-pay | Admitting: *Deleted

## 2018-03-21 NOTE — Patient Outreach (Signed)
Case closed- unable to maintain contact with pt, mailed case closure letter to pt home and faxed case closure letter to primary MD Dr. Livia Snellen.  Case closed  Jacqlyn Larsen [facility], [place] Coordinator [phone]

## 2018-03-25 DIAGNOSIS — M79676 Pain in unspecified toe(s): Secondary | ICD-10-CM | POA: Diagnosis not present

## 2018-03-25 DIAGNOSIS — B351 Tinea unguium: Secondary | ICD-10-CM | POA: Diagnosis not present

## 2018-03-25 DIAGNOSIS — I70203 Unspecified atherosclerosis of native arteries of extremities, bilateral legs: Secondary | ICD-10-CM | POA: Diagnosis not present

## 2018-03-25 DIAGNOSIS — L84 Corns and callosities: Secondary | ICD-10-CM | POA: Diagnosis not present

## 2018-03-26 NOTE — Progress Notes (Deleted)
2007 colonoscopy IMPRESSION:  1.  Normal rectum.  2.  Sigmoid diverticula.  3.  Diminutive polyp at 25 cm, cold biopsied/removed.  The remainder of the      colonic mucosa appeared normal. Hyperplastic.

## 2018-03-27 ENCOUNTER — Telehealth: Payer: Self-pay | Admitting: Gastroenterology

## 2018-03-27 ENCOUNTER — Encounter: Payer: Self-pay | Admitting: Internal Medicine

## 2018-03-27 ENCOUNTER — Ambulatory Visit: Payer: Medicare Other | Admitting: Gastroenterology

## 2018-03-27 NOTE — Telephone Encounter (Signed)
Patient was a no show and letter sent  °

## 2018-04-23 ENCOUNTER — Telehealth: Payer: Medicare Other | Admitting: Family Medicine

## 2018-06-26 ENCOUNTER — Ambulatory Visit: Payer: Medicare Other

## 2018-06-26 ENCOUNTER — Other Ambulatory Visit: Payer: Self-pay

## 2018-08-27 ENCOUNTER — Ambulatory Visit (INDEPENDENT_AMBULATORY_CARE_PROVIDER_SITE_OTHER): Payer: Medicare Other | Admitting: *Deleted

## 2018-08-27 DIAGNOSIS — G309 Alzheimer's disease, unspecified: Secondary | ICD-10-CM

## 2018-08-27 DIAGNOSIS — F0280 Dementia in other diseases classified elsewhere without behavioral disturbance: Secondary | ICD-10-CM

## 2018-08-27 DIAGNOSIS — R2681 Unsteadiness on feet: Secondary | ICD-10-CM

## 2018-08-27 DIAGNOSIS — R54 Age-related physical debility: Secondary | ICD-10-CM

## 2018-08-27 DIAGNOSIS — I10 Essential (primary) hypertension: Secondary | ICD-10-CM | POA: Diagnosis not present

## 2018-08-27 NOTE — Patient Instructions (Signed)
Visit Information  Goals Addressed            This Visit's Progress     Patient Stated   . "We would like to have some help with dad in our home" (pt-stated)       Current Barriers:  . Cognitive Deficits . Chronic Disease Management support and education needs related to dementia and risk for falls.   Nurse Case Manager Clinical Goal(s):  Marland Kitchen Over the next 30 days, patient will work with Consulting civil engineer to address needs related to inability to provide care for himself . Over the next 30 days, patient will work with [place] (community agency) to provide palliative care in the home  Interventions:  . Collaborated with Dr Livia Snellen regarding referral to [facility] for symptom management  and connection with resources to help in the home  Patient Self Care Activities:  . Currently UNABLE TO independently consistent self care  Initial goal documentation        The patient verbalized understanding of instructions provided today and declined a print copy of patient instruction materials.   The care management team will reach out to the patient again over the next 7 days.   Mr. Treptow was given information about Chronic Care Management services today including:  1. CCM service includes personalized support from designated clinical staff supervised by his physician, including individualized plan of care and coordination with other care providers 2. 24/7 contact phone numbers for assistance for urgent and routine care needs. 3. Service will only be billed when office clinical staff spend 20 minutes or more in a month to coordinate care. 4. Only one practitioner may furnish and bill the service in a calendar month. 5. The patient may stop CCM services at any time (effective at the end of the month) by phone call to the office staff. 6. The patient will be responsible for cost sharing (co-pay) of up to 20% of the service fee (after annual deductible is  met).  Patient agreed to services and verbal consent obtained.    Chong Sicilian BSN, RN-BC Embedded Chronic Care Manager [facility] / [place] Management Direct Dial: [phone]

## 2018-08-27 NOTE — Chronic Care Management (AMB) (Signed)
Chronic Care Management   Initial Visit Note  08/27/2018 Name: Kevin Nunez MRN: [id number] DOB: 01/09/1922  Referred by: Stacks, Warren, MD Reason for referral : Chronic Care Management (RNCM Outreach)   Kevin Nunez is a 85 y.o. year old male who is a primary care patient of Stacks, Warren, MD. The CCM team was consulted for assistance with chronic disease management and care coordination needs.   Review of patient status, including review of consultants reports, relevant laboratory and other test results, and collaboration with appropriate care team members and the patient's provider was performed as part of comprehensive patient evaluation and provision of chronic care management services.    Subjective: I spoke with patient's daughter, Kevin Nunez, by telephone today. Her father lives with her and her son and is unable to provide his own care. They are interesting in resources to help in the home but she knows that he won't qualify for Medicaid and they aren't able to pay out-of-pocket for in home help. They have considered checking the [facility] for assistance by they really don't know how to go about getting help. Kevin Nunez is dealing with serious health concerns herself and has to have help from her son and daughter.   Objective:  BP Readings from Last 3 Encounters:  03/12/18 127/69  02/03/18 129/61  02/03/18 (!) 131/54   BMI Readings from Last 3 Encounters:  03/12/18 24.04 kg/m  02/03/18 21.29 kg/m  12/25/17 23.42 kg/m    Assessment: Goals Addressed      Patient Stated   . "We would like to have some help with dad in our home" (pt-stated)       Current Barriers:  . Cognitive Deficits . Chronic Disease Management support and education needs related to dementia and risk for falls.   Nurse Case Manager Clinical Goal(s):  . Over the next 30 days, patient will work with RN Care Manager to address needs related to inability to provide care for himself . Over the next 30 days, patient  will work with [facility] (community agency) to provide palliative care in the home  Interventions:  . Collaborated with Dr Stacks regarding referral to [facility] for symptom management  and connection with resources to help in the home  Patient Self Care Activities:  . Currently UNABLE TO independently consistent self care  Initial goal documentation         Kevin Nunez was given information about Chronic Care Management services today including:  1. CCM service includes personalized support from designated clinical staff supervised by his physician, including individualized plan of care and coordination with other care providers 2. 24/7 contact phone numbers for assistance for urgent and routine care needs. 3. Service will only be billed when office clinical staff spend 20 minutes or more in a month to coordinate care. 4. Only one practitioner may furnish and bill the service in a calendar month. 5. The patient may stop CCM services at any time (effective at the end of the month) by phone call to the office staff. 6. The patient will be responsible for cost sharing (co-pay) of up to 20% of the service fee (after annual deductible is met).  Patient agreed to services and verbal consent obtained.   Plan:   The care management team will reach out to the patient again over the next 7 days.       BSN, RN-BC Embedded Chronic Care Manager [facility] / [facility] Care Management Direct Dial: [phone]        

## 2018-09-01 ENCOUNTER — Other Ambulatory Visit: Payer: Self-pay | Admitting: Family Medicine

## 2018-09-01 DIAGNOSIS — F0280 Dementia in other diseases classified elsewhere without behavioral disturbance: Secondary | ICD-10-CM

## 2018-09-01 DIAGNOSIS — G309 Alzheimer's disease, unspecified: Secondary | ICD-10-CM

## 2018-11-03 ENCOUNTER — Other Ambulatory Visit: Payer: Self-pay

## 2018-11-04 ENCOUNTER — Ambulatory Visit (INDEPENDENT_AMBULATORY_CARE_PROVIDER_SITE_OTHER): Admitting: Family Medicine

## 2018-11-04 ENCOUNTER — Encounter: Payer: Self-pay | Admitting: Family Medicine

## 2018-11-04 VITALS — BP 102/62 | HR 87 | Temp 97.3°F | Resp 20 | Ht 68.0 in | Wt 160.0 lb

## 2018-11-04 DIAGNOSIS — R2681 Unsteadiness on feet: Secondary | ICD-10-CM

## 2018-11-04 DIAGNOSIS — G309 Alzheimer's disease, unspecified: Secondary | ICD-10-CM | POA: Diagnosis not present

## 2018-11-04 DIAGNOSIS — I10 Essential (primary) hypertension: Secondary | ICD-10-CM | POA: Diagnosis not present

## 2018-11-04 DIAGNOSIS — F0280 Dementia in other diseases classified elsewhere without behavioral disturbance: Secondary | ICD-10-CM

## 2018-11-04 DIAGNOSIS — Z23 Encounter for immunization: Secondary | ICD-10-CM | POA: Diagnosis not present

## 2018-11-04 MED ORDER — TRAZODONE HCL 50 MG PO TABS: 25.0000 mg | ORAL_TABLET | Freq: Every evening | ORAL | 3 refills | Status: AC | PRN

## 2018-11-04 NOTE — Progress Notes (Signed)
Subjective:  Patient ID: Kevin Nunez, male    DOB: 12/21/1921  Age: 85 y.o. MRN: [id number]  CC: Medical Management of Chronic Issues (6 mo) and Hypertension   HPI Kevin Nunez presents for [place] form completion for permanaent need for aid and attendance.   Depression screen [facility] 2/9 11/04/2018 02/03/2018 01/24/2018  Decreased Interest 0 0 0  Down, Depressed, Hopeless 1 0 0  PHQ - 2 Score 1 0 0  Altered sleeping - - -  Tired, decreased energy - - -  Change in appetite - - -  Feeling bad or failure about yourself  - - -  Trouble concentrating - - -  Moving slowly or fidgety/restless - - -  Suicidal thoughts - - -  PHQ-9 Score - - -  Difficult doing work/chores - - -    History Kevin Nunez has a past medical history of Alzheimer's dementia ([place]), Cancer ([place]), Hypertension, and Renal disorder.   He has a past surgical history that includes Cholecystectomy.   His family history is not on file.He reports that he has never smoked. He has never used smokeless tobacco. He reports that he does not drink alcohol or use drugs.    ROS Review of Systems  Constitutional: Negative.   HENT: Negative.   Eyes: Negative for visual disturbance.  Respiratory: Negative for cough and shortness of breath.   Cardiovascular: Negative for chest pain and leg swelling.  Gastrointestinal: Negative for abdominal pain, diarrhea, nausea and vomiting.  Genitourinary: Negative for difficulty urinating.  Musculoskeletal: Positive for arthralgias, gait problem and myalgias.       Pt. Is very weak, walks only 20 feet at a time due to dyspnea. He is unstable.  Skin: Negative for rash.  Neurological: Negative for headaches.  Psychiatric/Behavioral: Positive for confusion and decreased concentration. Negative for sleep disturbance.    Objective:  BP 102/62   Pulse 87   Temp (!) 97.3 F (36.3 C)   Resp 20   Ht 5\' 8"  (1.727 m)   Wt 160 lb (72.6 kg)   SpO2 96%   BMI 24.33 kg/m   BP Readings from Last  3 Encounters:  11/04/18 102/62  03/12/18 127/69  02/03/18 129/61    Wt Readings from Last 3 Encounters:  11/04/18 160 lb (72.6 kg)  03/12/18 158 lb 2 oz (71.7 kg)  02/03/18 140 lb (63.5 kg)     Physical Exam Vitals signs reviewed.  Constitutional:      Appearance: He is well-developed. He is ill-appearing. He is not toxic-appearing.  HENT:     Head: Normocephalic and atraumatic.     Right Ear: Tympanic membrane and external ear normal. No decreased hearing noted.     Left Ear: Tympanic membrane and external ear normal. No decreased hearing noted.     Mouth/Throat:     Pharynx: No oropharyngeal exudate or posterior oropharyngeal erythema.  Eyes:     Pupils: Pupils are equal, round, and reactive to light.  Neck:     Musculoskeletal: Normal range of motion and neck supple.  Cardiovascular:     Rate and Rhythm: Normal rate and regular rhythm.     Heart sounds: No murmur.  Pulmonary:     Effort: No respiratory distress.     Breath sounds: No wheezing.     Comments: Distant  Abdominal:     General: Bowel sounds are normal.     Palpations: Abdomen is soft. There is no mass.     Tenderness: There is no abdominal  tenderness.  Musculoskeletal:        General: Deformity (atrophy of muscular of BLE is significant. Gait is weak, slow, stooped.) present.     Comments: Pt.has 3/5 strength at best for flexion, extension, abduction and rotation of the upper extremities  Skin:    General: Skin is warm and dry.  Neurological:     Motor: Weakness present.     Coordination: Coordination abnormal.     Gait: Gait abnormal.     Deep Tendon Reflexes: Reflexes abnormal.  Psychiatric:        Mood and Affect: Mood normal.       Assessment & Plan:   Kevin Nunez was seen today for medical management of chronic issues and hypertension.  Diagnoses and all orders for this visit:  Essential hypertension  Alzheimer's dementia without behavioral disturbance, unspecified timing of dementia  onset ([place])  Gait instability  Need for immunization against influenza -     Flu Vaccine QUAD High Dose(Fluad)  Other orders -     traZODone (DESYREL) 50 MG tablet; Take 0.5-1 tablets (25-50 mg total) by mouth at bedtime as needed for sleep.    I have discontinued [employer] ([place] asiatica, (GOTU KOLA PO)), Melatonin, dextromethorphan, and traZODone. I am also having him start on traZODone. Additionally, I am having him maintain his ibuprofen, albuterol, ondansetron, allopurinol, cetirizine, multivitamin with minerals, Ensure, Chlorphen-Phenyleph-ASA, and memantine. We will continue to administer albuterol.  Allergies as of 11/04/2018      Reactions   Codeine Other (See Comments)   Altered mental status      Medication List       Accurate as of November 04, 2018 11:59 PM. If you have any questions, ask your nurse or doctor.        STOP taking these medications   Delsym 30 MG/5ML liquid Generic drug: dextromethorphan Stopped by: Claretta Fraise, MD   [place] by: Claretta Fraise, MD   Melatonin 10 MG Tabs Stopped by: Claretta Fraise, MD     TAKE these medications   albuterol (2.5 MG/3ML) 0.083% nebulizer solution Commonly known as: PROVENTIL Take 3 mLs (2.5 mg total) by nebulization every 6 (six) hours as needed for wheezing or shortness of breath.   Alka-Seltzer Plus Cold 2-7.8-325 MG Tbef Generic drug: Chlorphen-Phenyleph-ASA Take 1 tablet by mouth daily as needed (for cold).   allopurinol 100 MG tablet Commonly known as: ZYLOPRIM Take 1 tablet (100 mg total) by mouth daily. What changed: when to take this   cetirizine 10 MG tablet Commonly known as: ZYRTEC Take 10 mg by mouth at bedtime.   Ensure Take 237 mLs by mouth daily.   ibuprofen 200 MG tablet Commonly known as: ADVIL Take 200 mg by mouth every 6 (six) hours as needed for mild pain or moderate pain.   memantine 10 MG tablet Commonly known as: NAMENDA Take 1 tablet (10  mg total) by mouth 2 (two) times daily.   multivitamin with minerals Tabs tablet Take 1 tablet by mouth at bedtime.   ondansetron 8 MG disintegrating tablet Commonly known as: ZOFRAN-ODT Take 1 tablet (8 mg total) by mouth every 8 (eight) hours as needed for nausea or vomiting.   traZODone 50 MG tablet Commonly known as: DESYREL Take 0.5-1 tablets (25-50 mg total) by mouth at bedtime as needed for sleep. What changed:   medication strength  how much to take  how to take this  when to take this  reasons to take this  additional instructions Changed by: Claretta Fraise, MD       Aid and attendance form scompleted to support pt.'s need for  Follow-up: Return in about 6 weeks (around 12/16/2018), or if symptoms worsen or fail to improve.  Claretta Fraise, M.D.

## 2018-11-05 ENCOUNTER — Encounter: Payer: Self-pay | Admitting: Family Medicine

## 2018-11-20 DIAGNOSIS — B351 Tinea unguium: Secondary | ICD-10-CM | POA: Diagnosis not present

## 2018-11-20 DIAGNOSIS — L84 Corns and callosities: Secondary | ICD-10-CM | POA: Diagnosis not present

## 2018-11-20 DIAGNOSIS — I70203 Unspecified atherosclerosis of native arteries of extremities, bilateral legs: Secondary | ICD-10-CM | POA: Diagnosis not present

## 2018-11-20 DIAGNOSIS — M79676 Pain in unspecified toe(s): Secondary | ICD-10-CM | POA: Diagnosis not present

## 2018-12-31 ENCOUNTER — Telehealth: Payer: Self-pay | Admitting: Family Medicine

## 2018-12-31 NOTE — Chronic Care Management (AMB) (Signed)
°  Care Management   Note  12/31/2018 Name: Kevin Nunez MRN: [id number] DOB: [birth year]  Kevin Nunez is a 85 y.o. year old male who is a primary care patient of Stacks, Cletus Gash, MD and is actively engaged with the care management team. I reached out to [name] by phone today to assist with scheduling a follow up appointment with the RN Case Manager  Follow up plan: Telephone appointment with CCM team member scheduled for: 01/01/2019  [place], [place] Management  [place], [place] [address] Direct Dial: [email or web address]  Website: [email or web address]

## 2019-01-01 ENCOUNTER — Ambulatory Visit: Payer: Medicare Other | Admitting: *Deleted

## 2019-01-19 ENCOUNTER — Other Ambulatory Visit: Payer: Self-pay | Admitting: Family Medicine

## 2019-01-26 NOTE — Chronic Care Management (AMB) (Signed)
  Chronic Care Management   Outreach Note  01/01/2019 Name: Kevin Nunez MRN: [id number] DOB: May 08, 1921  Referred by: Claretta Fraise, MD Reason for referral : Chronic Care Management (Initial CCM outreach)   An unsuccessful telephone follow-up was attempted today. The patient was referred to the case management team by for assistance with care management and care coordination.   Follow Up Plan: The care management team will reach out to the patient again over the next 30 days.   Chong Sicilian, BSN, RN-BC Embedded Chronic Care Manager [facility] / [place] Management Direct Dial: [phone]

## 2019-02-18 ENCOUNTER — Ambulatory Visit

## 2019-02-18 NOTE — Chronic Care Management (AMB) (Signed)
  Chronic Care Management   Note  02/18/2019 Name: Kevin Nunez MRN: [id number] DOB: [birth year]  Unsuccessful outreach to patient for CCM services. Patient was referred for care coordination and chronic care management.   Follow up plan: A HIPAA compliant phone message was left for the patient providing contact information and requesting a return call.   Chong Sicilian, BSN, RN-BC Embedded Chronic Care Manager [facility] / [place] Management Direct Dial: [phone]

## 2019-02-27 ENCOUNTER — Telehealth: Payer: Medicare Other

## 2019-03-04 ENCOUNTER — Telehealth: Payer: Medicare Other

## 2019-03-04 ENCOUNTER — Encounter: Payer: Self-pay | Admitting: Family Medicine

## 2019-03-04 ENCOUNTER — Telehealth: Payer: Self-pay | Admitting: Family Medicine

## 2019-03-04 NOTE — Telephone Encounter (Signed)
Pt's granddaughter is needing Dr Livia Snellen to write a letter of incompetence for patient. Says her mother was POA of patient but has recently passed away and granddaughter is now POA of patient. Says the bank is requesting it and is urgent that she gets one asap.

## 2019-03-04 NOTE — Telephone Encounter (Signed)
I completed the letter. My printer is down. Please print and I will sign. Thnaks, WS

## 2019-03-04 NOTE — Telephone Encounter (Signed)
Printed - per [employer] = someone coming to pick up

## 2019-03-24 ENCOUNTER — Telehealth: Payer: Self-pay | Admitting: Family Medicine

## 2019-05-12 ENCOUNTER — Other Ambulatory Visit: Payer: Self-pay | Admitting: Family Medicine

## 2019-08-01 ENCOUNTER — Telehealth: Payer: Self-pay | Admitting: Family Medicine

## 2019-08-01 NOTE — Telephone Encounter (Signed)
Hospice nurse caroline called and he was chained to the bed by family.  The girl said he was chained because he is bedbound because he tries to get up and has safety concerns about him being chained to the bed.  The family member acted like it was no big deal. She filed a report APS. He is bedbound and dying and she just wanted to inform us in case APS contacts [place].   Caryl Pina, MD [place] [facility] 08/01/2019, 8:30 PM

## 2019-09-19 ENCOUNTER — Other Ambulatory Visit: Payer: Self-pay | Admitting: Family Medicine

## 2019-09-21 ENCOUNTER — Telehealth: Payer: Self-pay | Admitting: Family Medicine

## 2019-09-30 DEATH — deceased

## 2020-02-19 IMAGING — CT CT ABD-PELV W/ CM
2 of 5 series · 16 of 46 positions shown, 18 images · IV contrast (iopamidol)
Comparison: None.

CLINICAL DATA: Nausea, vomiting and abdominal back pain.

EXAM:
CT ABDOMEN AND PELVIS WITH CONTRAST
TECHNIQUE: Multidetector CT imaging of the abdomen and pelvis was performed
using the standard protocol following bolus administration of
intravenous contrast.
CONTRAST:  75mL C5J3QP-GTT IOPAMIDOL (C5J3QP-GTT) INJECTION 61%

[Series 3: axial st · axial · 0.80mm/px · z∈[+680,+1075]mm · 13 of 89 slices shown, 15 images]
[im 5/89  soft-tissue]
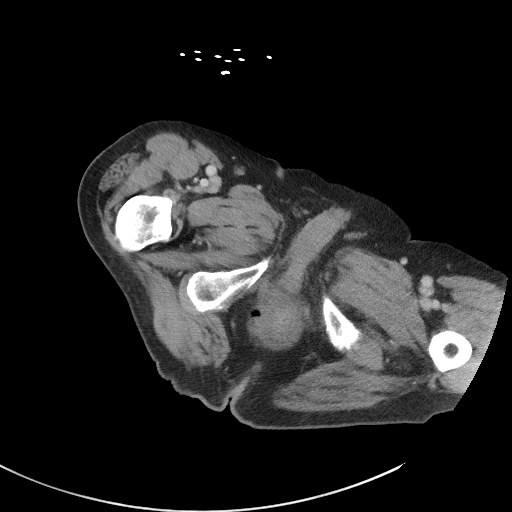
[im 5/89  bone]
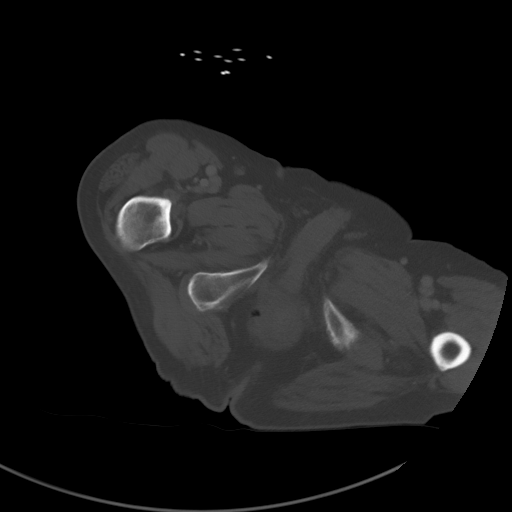
[im 14/89  soft-tissue]
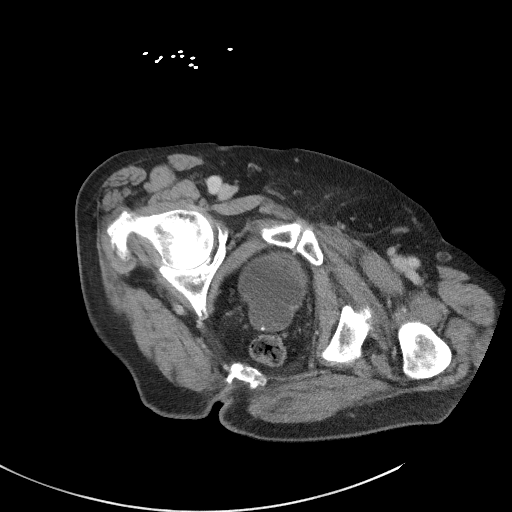
[im 19/89  soft-tissue]
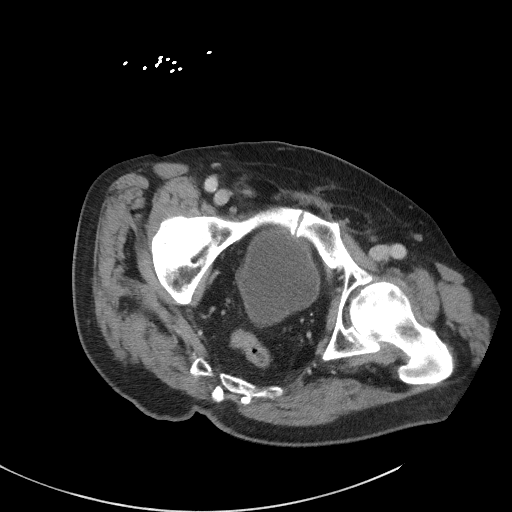
[im 24/89  soft-tissue]
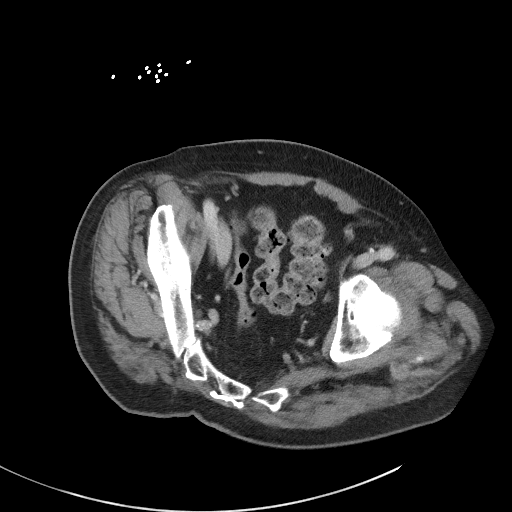
[im 33/89  soft-tissue]
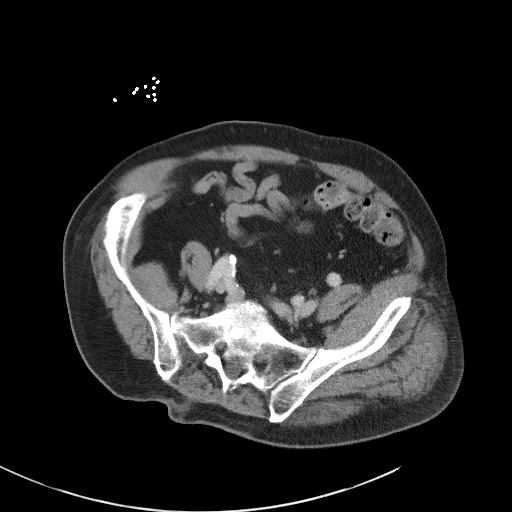
[im 38/89  soft-tissue]
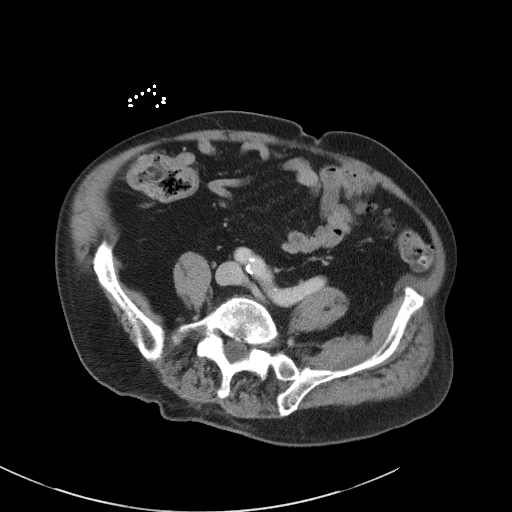
[im 47/89  soft-tissue]
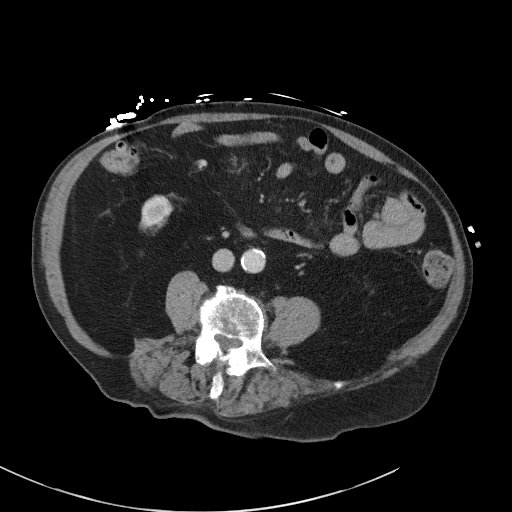
[im 51/89  soft-tissue]
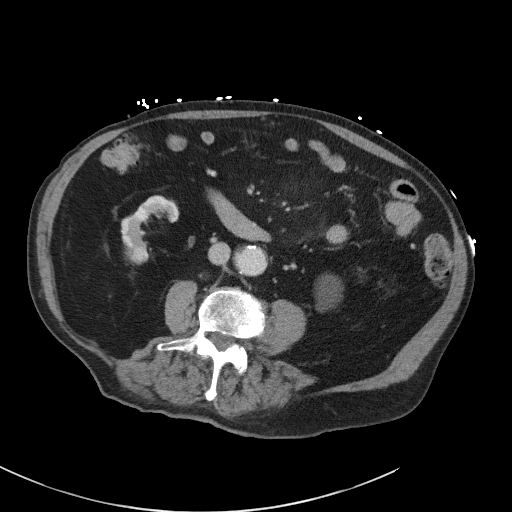
[im 56/89  soft-tissue]
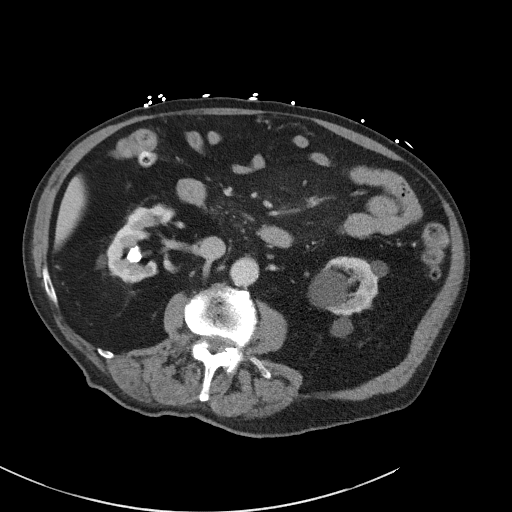
[im 56/89  bone]
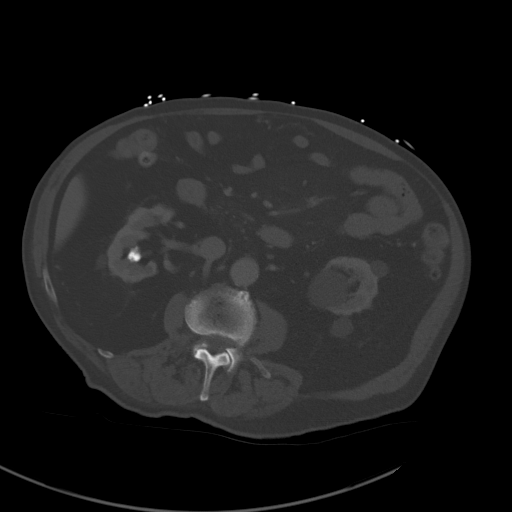
[im 65/89  soft-tissue]
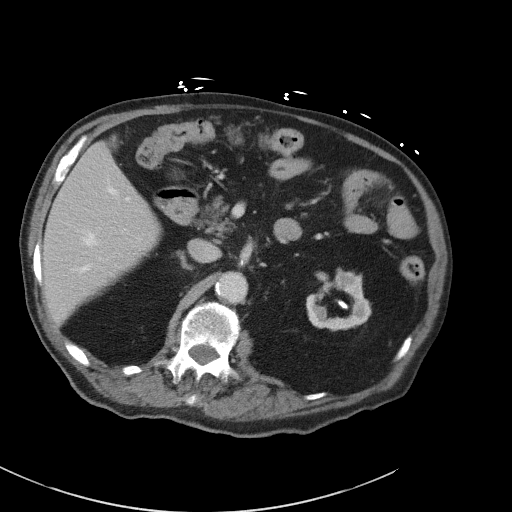
[im 70/89  soft-tissue]
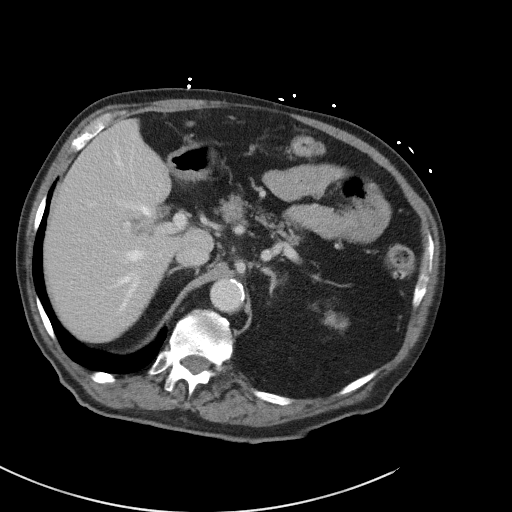
[im 75/89  soft-tissue]
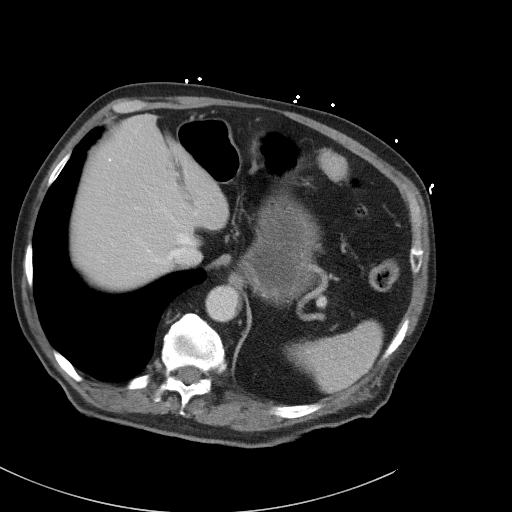
[im 84/89  soft-tissue]
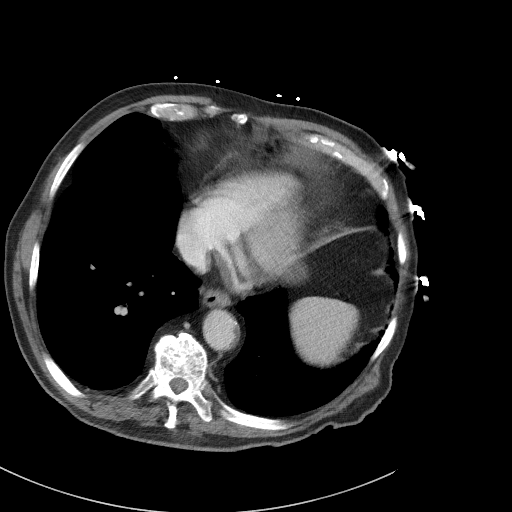

[Series 6: coronal st · coronal · 0.78mm/px · 3 of 92 slices shown]
[im 31/92  soft-tissue]
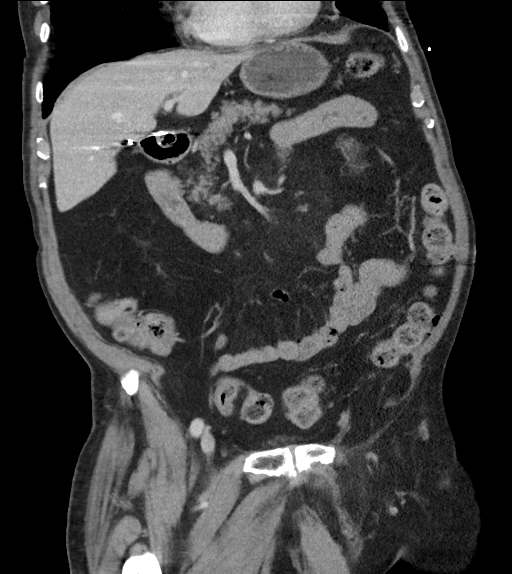
[im 41/92  soft-tissue]
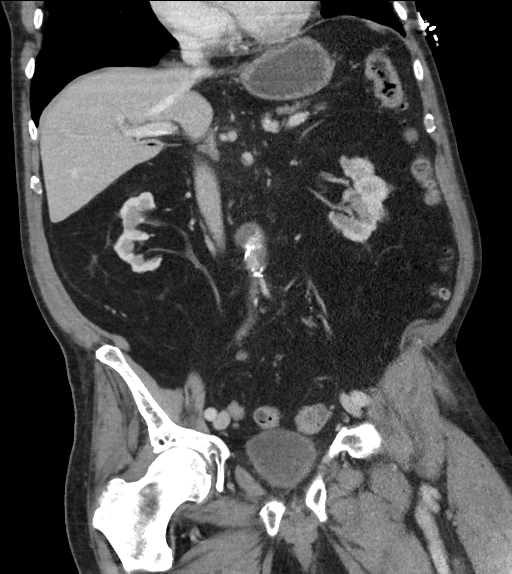
[im 51/92  soft-tissue]
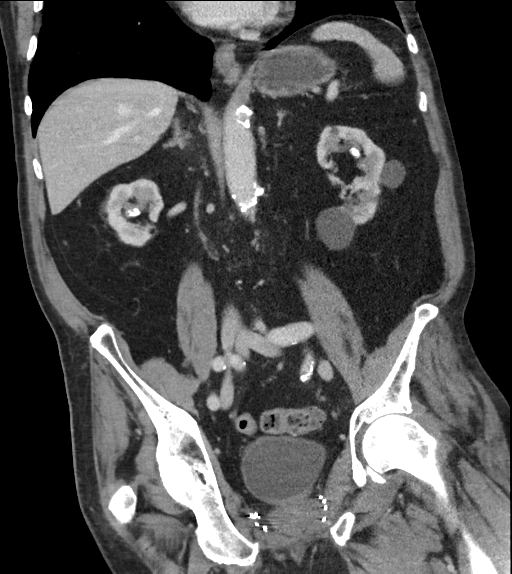

[16 of 46 positions shown; findings below may reference images not displayed]

FINDINGS: Lower chest: The lung bases are clear. No pleural effusion. The
heart is normal in size. No pericardial effusion. Moderate aortic
calcifications are noted along with three-vessel coronary artery
calcifications.

Hepatobiliary: No focal hepatic lesions or intrahepatic biliary
dilatation. The gallbladder surgically absent. No common bile duct
dilatation.

Pancreas: No mass, inflammation or ductal dilatation.

Spleen: Normal size.  No focal lesions.

Adrenals/Urinary Tract: The adrenal glands are normal.

Bilateral renal calculi. There is a 13 mm calculus on the right and
a 8 mm calculus on the left. There also bilateral renal cysts and
moderate bilateral renal scarring changes. No obstructing ureteral
calculi or bladder calculi. No worrisome renal or bladder lesions.

Stomach/Bowel: The stomach, duodenum, small bowel and colon are
grossly normal. No acute inflammatory changes, mass lesions or
obstructive findings. Colonic diverticulosis without findings for
acute diverticulitis.

Vascular/Lymphatic: Advanced atherosclerotic calcifications
involving the aorta and branch vessels. No dissection. There is a 3
cm infrarenal abdominal aortic aneurysm.

No mesenteric or retroperitoneal mass or adenopathy.

Reproductive: Brachytherapy seeds noted in the prostate gland.

Other: Bilateral inguinal hernias containing only fat are noted.

Musculoskeletal: No significant bony findings.
IMPRESSION: 1. No acute abdominal/pelvic findings, mass lesions or adenopathy.
2. Bilateral renal calculi but no obstructing ureteral calculi or
bladder calculi.
3. Numerous renal cysts and renal scarring changes but no worrisome
renal lesions. No worrisome bladder lesions.
4. Brachytherapy seeds noted in the prostate gland.
5. Colonic diverticulosis without findings for acute diverticulitis.

## 2020-02-19 IMAGING — CR DG CHEST 1V PORT
1 series · 1 of 1 positions shown · non-contrast
Comparison: 07/01/2017

CLINICAL DATA: Abdominal pain and back pain.  Nausea and vomiting.

EXAM:
PORTABLE CHEST 1 VIEW

[portable]
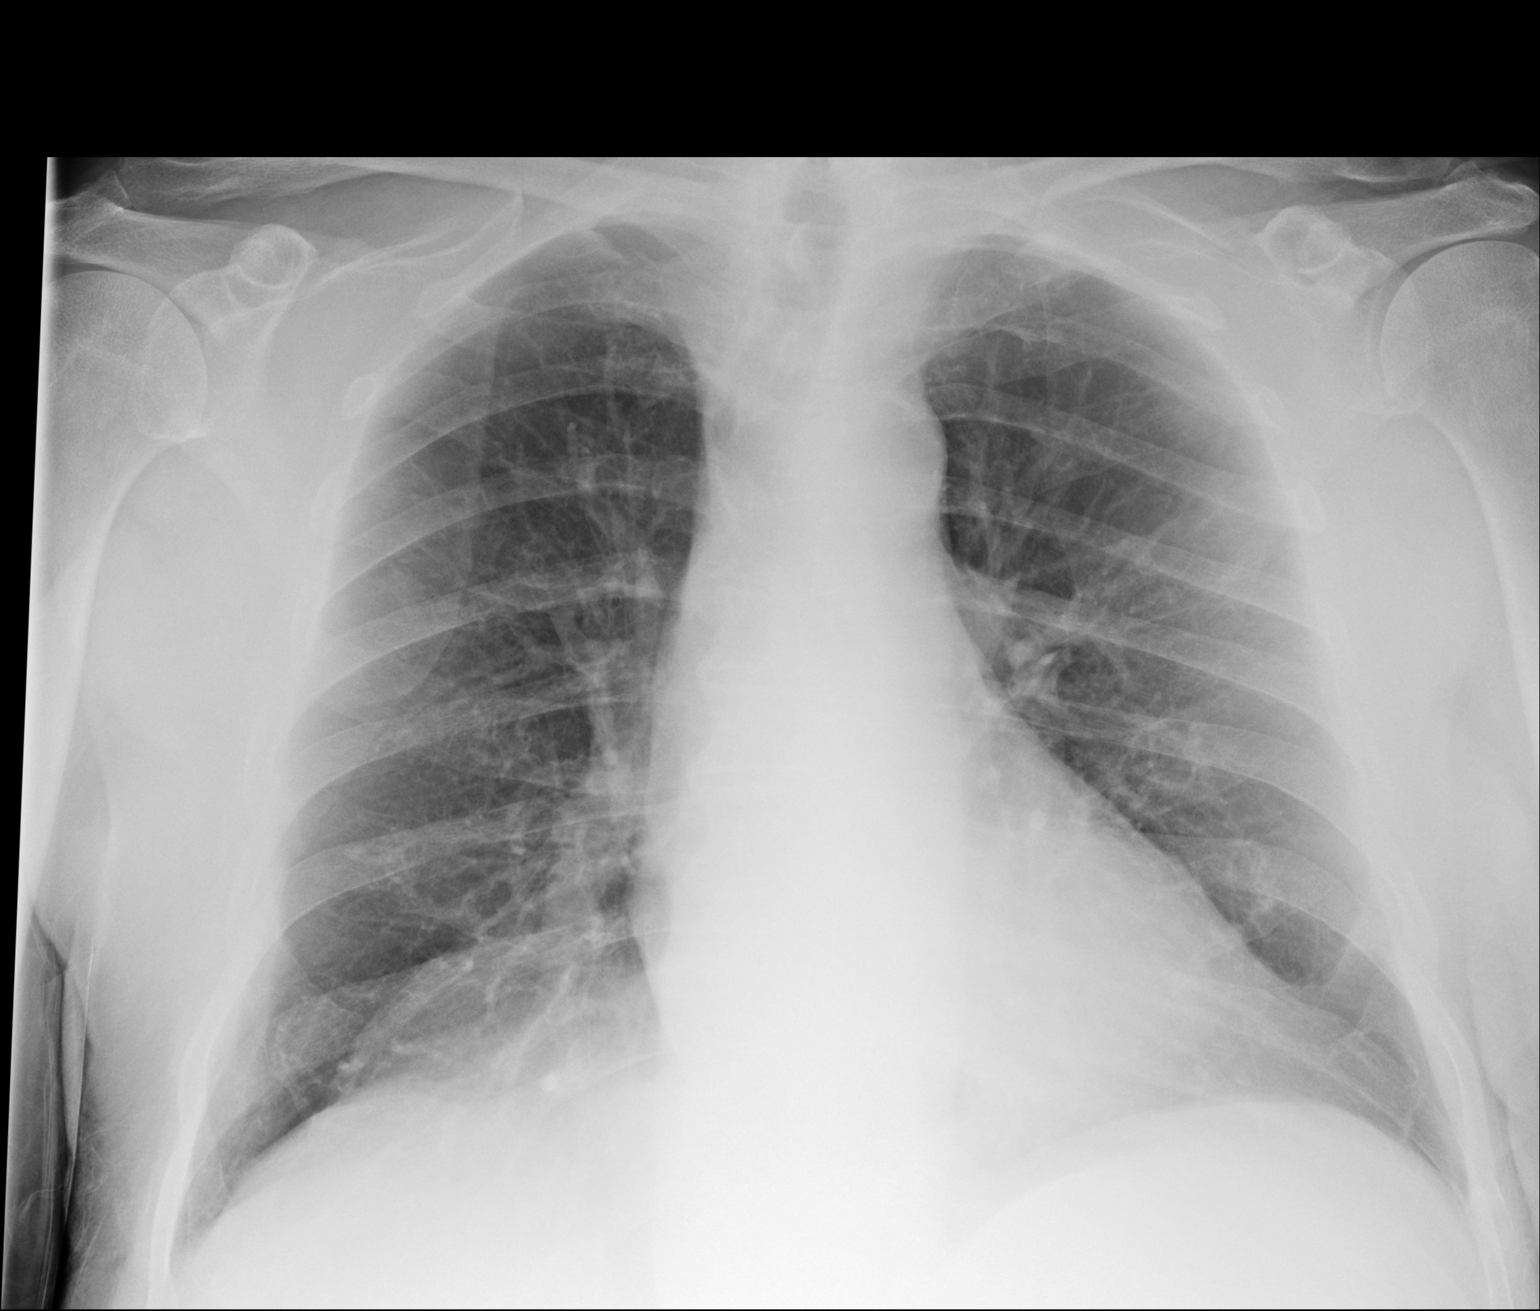

[1 of 1 positions shown; findings below may reference images not displayed]

FINDINGS: The heart size and mediastinal contours are within normal limits.
Both lungs are clear. The visualized skeletal structures are
unremarkable.
IMPRESSION: No active disease.

## 2021-06-06 ENCOUNTER — Ambulatory Visit: Payer: Self-pay | Admitting: *Deleted

## 2021-06-06 NOTE — Chronic Care Management (AMB) (Signed)
? ?  06/06/2021 ? ?[name] ?May 16, 1921 ?[id number] ? ? ?Care Management  ? ?Follow Up Note ? ? ?06/06/2021 ?Name: Keidrick Murty MRN: [id number] DOB: 1921-03-24 ? ? ?Referred by: Claretta Fraise, MD ?Reason for referral : Chronic Care Management (Case Closure) ? ? ?Patient was referred for care management engagement. It is noted in the EHR that the patient is deceased as of 2019-10-10 (date). The primary care provider has been notified of this information. No further outreach on the part of the embedded care management team will be made.  ? ? ?Chong Sicilian, BSN, RN-BC ?Embedded Chronic Care Manager ?[facility] [employer] [facility] ?Direct Dial: [phone] ? ? ? ? ?
